# Patient Record
Sex: Male | Born: 1955 | Race: White | Hispanic: No | Marital: Married | Smoking: Former smoker
Health system: Southern US, Community
[De-identification: ages and names within clinical notes are randomized; demographics above are authoritative.]

## PROBLEM LIST (undated history)

## (undated) DIAGNOSIS — E7800 Pure hypercholesterolemia, unspecified: Secondary | ICD-10-CM

## (undated) HISTORY — PX: TONSILLECTOMY: SUR1361

---

## 1995-09-13 HISTORY — PX: VASECTOMY: SHX75

## 2008-03-14 DIAGNOSIS — E782 Mixed hyperlipidemia: Secondary | ICD-10-CM | POA: Insufficient documentation

## 2008-08-04 LAB — BASIC METABOLIC PANEL: Creatinine: 1 mg/dL (ref 0.6–1.3)

## 2012-07-13 ENCOUNTER — Ambulatory Visit: Payer: Self-pay | Admitting: Medical

## 2012-09-10 LAB — HM COLONOSCOPY

## 2013-10-25 LAB — BASIC METABOLIC PANEL
BUN: 19 mg/dL (ref 4–21)
GLUCOSE: 94 mg/dL
POTASSIUM: 4.5 mmol/L (ref 3.4–5.3)

## 2013-10-25 LAB — LIPID PANEL
Cholesterol: 178 mg/dL (ref 0–200)
HDL: 41 mg/dL (ref 35–70)
LDL CALC: 119 mg/dL
Triglycerides: 88 mg/dL (ref 40–160)

## 2015-07-29 ENCOUNTER — Other Ambulatory Visit: Payer: Self-pay | Admitting: Family Medicine

## 2015-10-25 ENCOUNTER — Other Ambulatory Visit: Payer: Self-pay | Admitting: Family Medicine

## 2016-02-09 ENCOUNTER — Other Ambulatory Visit: Payer: Self-pay | Admitting: Family Medicine

## 2016-02-09 ENCOUNTER — Telehealth: Payer: Self-pay | Admitting: Family Medicine

## 2016-02-09 DIAGNOSIS — E785 Hyperlipidemia, unspecified: Secondary | ICD-10-CM

## 2016-02-09 MED ORDER — PRAVASTATIN SODIUM 20 MG PO TABS: 20.0000 mg | ORAL_TABLET | Freq: Every day | ORAL | Status: DC

## 2016-02-09 NOTE — Telephone Encounter (Signed)
I have refilled but it is time for an office visit.

## 2016-02-09 NOTE — Telephone Encounter (Signed)
Please review. Thanks!  

## 2016-02-09 NOTE — Telephone Encounter (Signed)
Pt requesting a refill on his pravastatin (PRAVACHOL) 20 MG tablet.    Pt is out of this medication.  [facility]  Thanks CC

## 2016-02-10 NOTE — Telephone Encounter (Signed)
Patient has been advised

## 2016-05-17 ENCOUNTER — Ambulatory Visit (INDEPENDENT_AMBULATORY_CARE_PROVIDER_SITE_OTHER): Payer: Managed Care, Other (non HMO) | Admitting: Family Medicine

## 2016-05-17 ENCOUNTER — Encounter: Payer: Self-pay | Admitting: Family Medicine

## 2016-05-17 VITALS — BP 122/84 | HR 70 | Temp 97.6°F | Resp 16 | Ht 71.0 in | Wt 181.6 lb

## 2016-05-17 DIAGNOSIS — E782 Mixed hyperlipidemia: Secondary | ICD-10-CM | POA: Diagnosis not present

## 2016-05-17 DIAGNOSIS — Z0000 Encounter for general adult medical examination without abnormal findings: Secondary | ICD-10-CM | POA: Diagnosis not present

## 2016-05-17 NOTE — Patient Instructions (Signed)
We will call you with the lab results. 

## 2016-05-17 NOTE — Progress Notes (Signed)
Subjective:     Patient ID: Ronald Neal Fellman, male   DOB: December 16, 1955, 60 y.o.   MRN: [id number]  HPI  Chief Complaint  Patient presents with  . Annual Exam    Patient comes in office today for his anual physical, patient states that he would like to address symptoms of fatigue that has been occuring over the last 3 years and to address frequency of urination in PM. Patients last reported Tdap: 03/12/08, Colonoscopy: 09/10/12, patient is due today for Flu vaccine but he has declined stating that he will get it at work.   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   Review of Systems General: Feeling well, will check his insurance regarding Zostavax. States he does stretching in the AM but no other regular exercise. HEENT: regular dental visits and eye exams. Cardiovascular: no chest pain, shortness of breath, or palpitations GI: rare heartburn, no change in bowel habits or blood in the stool GU: nocturia x 1, no change in bladder habits  Psychiatric: not depressed; states his wife has chronic fatigue syndrome andit requires a lot of patience to live with her. Musculoskeletal: no joint pain    Objective:   Physical Exam  Constitutional: He appears well-developed and well-nourished. No distress.  Eyes: PERRLA, EOMI Neck: no thyromegaly, tenderness or nodules, no carotid bruits, no cervical adenopathy ENT: TM's intact without inflammation; No tonsillar enlargement or exudate, Lungs: Clear Heart : RRR without murmur or gallop Abd: bowel sounds present, soft, non-tender, no organomegaly Rectal: Prostate firm and non-tender Extremities: no edema Skin: no atypical lesions noted on his back     Assessment:    1. Annual physical exam - Comprehensive metabolic panel - PSA - CBC with Differential/Platelet  2. Combined fat and carbohydrate induced hyperlipemia - Lipid panel    Plan:    Further f/u pending lab work.

## 2016-05-18 ENCOUNTER — Other Ambulatory Visit: Payer: Self-pay | Admitting: Family Medicine

## 2016-05-18 ENCOUNTER — Telehealth: Payer: Self-pay

## 2016-05-18 DIAGNOSIS — E785 Hyperlipidemia, unspecified: Secondary | ICD-10-CM

## 2016-05-18 LAB — COMPREHENSIVE METABOLIC PANEL
A/G RATIO: 2.2 (ref 1.2–2.2)
ALBUMIN: 4.8 g/dL (ref 3.6–4.8)
ALT: 20 IU/L (ref 0–44)
AST: 19 IU/L (ref 0–40)
Alkaline Phosphatase: 57 IU/L (ref 39–117)
BUN/Creatinine Ratio: 15 (ref 10–24)
BUN: 17 mg/dL (ref 8–27)
Bilirubin Total: 0.6 mg/dL (ref 0.0–1.2)
CALCIUM: 9.4 mg/dL (ref 8.6–10.2)
CO2: 25 mmol/L (ref 18–29)
CREATININE: 1.15 mg/dL (ref 0.76–1.27)
Chloride: 103 mmol/L (ref 96–106)
GFR, EST AFRICAN AMERICAN: 78 mL/min/{1.73_m2} (ref >59)
GFR, EST NON AFRICAN AMERICAN: 68 mL/min/{1.73_m2} (ref >59)
GLOBULIN, TOTAL: 2.2 g/dL (ref 1.5–4.5)
Glucose: 92 mg/dL (ref 65–99)
POTASSIUM: 5.2 mmol/L (ref 3.5–5.2)
SODIUM: 143 mmol/L (ref 134–144)
TOTAL PROTEIN: 7 g/dL (ref 6.0–8.5)

## 2016-05-18 LAB — CBC WITH DIFFERENTIAL/PLATELET
BASOS ABS: 0.1 10*3/uL (ref 0.0–0.2)
BASOS: 1 %
EOS (ABSOLUTE): 0.6 10*3/uL — AB (ref 0.0–0.4)
Eos: 10 %
Hematocrit: 44.7 % (ref 37.5–51.0)
Hemoglobin: 15.5 g/dL (ref 12.6–17.7)
IMMATURE GRANULOCYTES: 1 %
Immature Grans (Abs): 0 10*3/uL (ref 0.0–0.1)
LYMPHS: 36 %
Lymphocytes Absolute: 2.3 10*3/uL (ref 0.7–3.1)
MCH: 29.9 pg (ref 26.6–33.0)
MCHC: 34.7 g/dL (ref 31.5–35.7)
MCV: 86 fL (ref 79–97)
MONOS ABS: 0.5 10*3/uL (ref 0.1–0.9)
Monocytes: 8 %
NEUTROS PCT: 44 %
Neutrophils Absolute: 2.9 10*3/uL (ref 1.4–7.0)
PLATELETS: 201 10*3/uL (ref 150–379)
RBC: 5.19 x10E6/uL (ref 4.14–5.80)
RDW: 13.5 % (ref 12.3–15.4)
WBC: 6.4 10*3/uL (ref 3.4–10.8)

## 2016-05-18 LAB — LIPID PANEL
CHOLESTEROL TOTAL: 188 mg/dL (ref 100–199)
Chol/HDL Ratio: 4.9 ratio units (ref 0.0–5.0)
HDL: 38 mg/dL — ABNORMAL LOW (ref >39)
LDL Calculated: 125 mg/dL — ABNORMAL HIGH (ref 0–99)
Triglycerides: 127 mg/dL (ref 0–149)
VLDL Cholesterol Cal: 25 mg/dL (ref 5–40)

## 2016-05-18 LAB — PSA: Prostate Specific Ag, Serum: 2.4 ng/mL (ref 0.0–4.0)

## 2016-05-18 MED ORDER — PRAVASTATIN SODIUM 40 MG PO TABS
40.0000 mg | ORAL_TABLET | Freq: Every day | ORAL | 3 refills | Status: DC
Start: 2016-05-18 — End: 2017-05-25

## 2016-05-18 NOTE — Telephone Encounter (Signed)
Advised pt of lab results. Pt verbally acknowledges understanding. Emily Drozdowski, CMA   

## 2016-05-18 NOTE — Telephone Encounter (Signed)
-----   Message from Anola Gurneyobert Chauvin, [place] sent at 05/18/2016  7:54 AM EDT ----- Lab ok except cholesterol is not at goal of LDL < 100. Will increase Pravastatin to 40 mg.. Call for lab slip in 6-8 weeks to recheck.

## 2016-05-18 NOTE — Telephone Encounter (Signed)
[id number] 

## 2016-06-03 ENCOUNTER — Ambulatory Visit: Payer: Managed Care, Other (non HMO) | Admitting: Family Medicine

## 2016-06-06 ENCOUNTER — Encounter: Payer: Self-pay | Admitting: Family Medicine

## 2016-06-06 ENCOUNTER — Ambulatory Visit (INDEPENDENT_AMBULATORY_CARE_PROVIDER_SITE_OTHER): Payer: Managed Care, Other (non HMO) | Admitting: Family Medicine

## 2016-06-06 VITALS — Temp 98.5°F

## 2016-06-06 DIAGNOSIS — Z23 Encounter for immunization: Secondary | ICD-10-CM

## 2016-06-06 NOTE — Progress Notes (Signed)
Patient comes into office today to receive shingles vaccine, patient states that he is feeling well today and has no other questions or concerns. Patient was offered flu vaccine today but has declined stating that he will receive it at work.

## 2017-04-17 ENCOUNTER — Encounter: Payer: Self-pay | Admitting: Family Medicine

## 2017-05-25 ENCOUNTER — Other Ambulatory Visit: Payer: Self-pay | Admitting: Family Medicine

## 2017-05-25 DIAGNOSIS — E785 Hyperlipidemia, unspecified: Secondary | ICD-10-CM

## 2017-10-11 ENCOUNTER — Other Ambulatory Visit: Payer: Self-pay | Admitting: Family Medicine

## 2017-10-11 DIAGNOSIS — E785 Hyperlipidemia, unspecified: Secondary | ICD-10-CM

## 2017-10-11 NOTE — Telephone Encounter (Signed)
Pharmacy requesting refills. Thanks!  

## 2017-10-13 ENCOUNTER — Encounter: Payer: Self-pay | Admitting: Family Medicine

## 2017-10-13 ENCOUNTER — Ambulatory Visit: Payer: Commercial Managed Care - PPO | Admitting: Family Medicine

## 2017-10-13 VITALS — BP 124/80 | HR 103 | Temp 100.1°F | Resp 16 | Wt 184.8 lb

## 2017-10-13 DIAGNOSIS — R509 Fever, unspecified: Secondary | ICD-10-CM

## 2017-10-13 DIAGNOSIS — R05 Cough: Secondary | ICD-10-CM

## 2017-10-13 DIAGNOSIS — R059 Cough, unspecified: Secondary | ICD-10-CM

## 2017-10-13 LAB — POCT INFLUENZA A/B
Influenza A, POC: NEGATIVE
Influenza B, POC: NEGATIVE

## 2017-10-13 MED ORDER — DOXYCYCLINE HYCLATE 100 MG PO TABS: 100.0000 mg | ORAL_TABLET | Freq: Two times a day (BID) | ORAL | 0 refills | Status: DC

## 2017-10-13 MED ORDER — HYDROCODONE-HOMATROPINE 5-1.5 MG/5ML PO SYRP: ORAL_SOLUTION | ORAL | 0 refills | Status: DC

## 2017-10-13 NOTE — Progress Notes (Signed)
Subjective:     Patient ID: Ronald Neal, male   DOB: 1956-01-19, 62 y.o.   MRN: [id number] Chief Complaint  Patient presents with  . Cough    Patient comes into office today with complaints of cough, fever and congestion for about 10 days. Patient reports the following; sore throat, fever high of 100.2, non productive cough, gas, and feeling of weakness. Patient has tried otc Nyquil and cough drops.    HPI States he developed cold sx 10 days ago along with family members. Over the last 3 days he has developed fevers, chills and increased fatigue. He has continued to "drag" himself into work.  Review of Systems     Objective:   Physical Exam  Constitutional: He appears well-developed and well-nourished. No distress.  Ears: T.M's intact without inflammation Sinuses: non-tender Throat: tonsils absent, no erythema Neck: no cervical adenopathy Lungs: clear     Assessment:    1. Fever, unspecified fever cause - POCT Influenza A/B - DG Chest 2 View; Future - doxycycline (VIBRA-TABS) 100 MG tablet; Take 1 tablet (100 mg total) by mouth 2 (two) times daily.  Dispense: 20 tablet; Refill: 0  2. Cough:  ? Pneumonia  - DG Chest 2 View; Future - HYDROcodone-homatropine (HYCODAN) 5-1.5 MG/5ML syrup; 5 ml 4-6 hours as needed for cough  Dispense: 120 mL; Refill: 0 - doxycycline (VIBRA-TABS) 100 MG tablet; Take 1 tablet (100 mg total) by mouth 2 (two) times daily.  Dispense: 20 tablet; Refill: 0    Plan:    Further f/u pending x-ray report.

## 2017-10-13 NOTE — Patient Instructions (Signed)
We will call you with the x-ray report 

## 2017-10-16 ENCOUNTER — Other Ambulatory Visit: Payer: Self-pay | Admitting: Family Medicine

## 2017-10-16 ENCOUNTER — Ambulatory Visit
Admission: RE | Admit: 2017-10-16 | Discharge: 2017-10-16 | Disposition: A | Discharge status: Home / Self Care | Payer: Commercial Managed Care - PPO | Source: Ambulatory Visit | Attending: Family Medicine | Admitting: Family Medicine

## 2017-10-16 ENCOUNTER — Telehealth: Payer: Self-pay

## 2017-10-16 DIAGNOSIS — R509 Fever, unspecified: Secondary | ICD-10-CM | POA: Diagnosis not present

## 2017-10-16 DIAGNOSIS — R059 Cough, unspecified: Secondary | ICD-10-CM

## 2017-10-16 DIAGNOSIS — Z131 Encounter for screening for diabetes mellitus: Secondary | ICD-10-CM

## 2017-10-16 DIAGNOSIS — R05 Cough: Secondary | ICD-10-CM | POA: Insufficient documentation

## 2017-10-16 DIAGNOSIS — Z125 Encounter for screening for malignant neoplasm of prostate: Secondary | ICD-10-CM

## 2017-10-16 DIAGNOSIS — E782 Mixed hyperlipidemia: Secondary | ICD-10-CM

## 2017-10-16 NOTE — Telephone Encounter (Signed)
-----   Message from Anola Gurneyobert Chauvin, [place] sent at 10/16/2017 11:42 AM EST ----- No pneumonia. Continue antibiotic if helping.

## 2017-10-16 NOTE — Telephone Encounter (Signed)
Patient was advised he states that he still has a cough but it is non productive, patient denies shortness of breath or wheezing. Patient stats that he has 4 days left of antibiotic, I instructed patient to continue medication for symptom relief and if symptoms change or worsen to call the office back. KW

## 2017-10-17 ENCOUNTER — Telehealth: Payer: Self-pay

## 2017-10-17 LAB — COMPREHENSIVE METABOLIC PANEL
A/G RATIO: 1.7 (ref 1.2–2.2)
ALK PHOS: 69 IU/L (ref 39–117)
ALT: 18 IU/L (ref 0–44)
AST: 18 IU/L (ref 0–40)
Albumin: 4.3 g/dL (ref 3.6–4.8)
BILIRUBIN TOTAL: 0.5 mg/dL (ref 0.0–1.2)
BUN/Creatinine Ratio: 14 (ref 10–24)
BUN: 13 mg/dL (ref 8–27)
CALCIUM: 9 mg/dL (ref 8.6–10.2)
CHLORIDE: 105 mmol/L (ref 96–106)
CO2: 23 mmol/L (ref 20–29)
Creatinine, Ser: 0.91 mg/dL (ref 0.76–1.27)
GFR calc Af Amer: 103 mL/min/{1.73_m2} (ref >59)
GFR calc non Af Amer: 89 mL/min/{1.73_m2} (ref >59)
GLOBULIN, TOTAL: 2.5 g/dL (ref 1.5–4.5)
Glucose: 101 mg/dL — ABNORMAL HIGH (ref 65–99)
POTASSIUM: 4.3 mmol/L (ref 3.5–5.2)
SODIUM: 145 mmol/L — AB (ref 134–144)
Total Protein: 6.8 g/dL (ref 6.0–8.5)

## 2017-10-17 LAB — LIPID PANEL
Chol/HDL Ratio: 4.2 ratio (ref 0.0–5.0)
Cholesterol, Total: 140 mg/dL (ref 100–199)
HDL: 33 mg/dL — AB (ref >39)
LDL Calculated: 90 mg/dL (ref 0–99)
Triglycerides: 84 mg/dL (ref 0–149)
VLDL Cholesterol Cal: 17 mg/dL (ref 5–40)

## 2017-10-17 LAB — PSA: Prostate Specific Ag, Serum: 3.7 ng/mL (ref 0.0–4.0)

## 2017-10-17 NOTE — Telephone Encounter (Signed)
See below. KW 

## 2017-10-17 NOTE — Telephone Encounter (Signed)
Patient advised he would like to know if his pravastatin can drop down to 20mg  since his labs have improved? KW

## 2017-10-17 NOTE — Telephone Encounter (Signed)
-----   Message from Anola Gurneyobert Chauvin, [place] sent at 10/17/2017  7:32 AM EST ----- Labs ok except for increase in your prostate blood test. Would repeat in 6 months (call for lab slip). If you are having changes in your urinary pattern (increased urination at night, pain, poor stream) may wish to see you in the office sooner.

## 2017-10-17 NOTE — Telephone Encounter (Signed)
[id number] 

## 2017-10-17 NOTE — Telephone Encounter (Signed)
I would not advise that as your cholesterol has improved because of the medication. It will keep your risk for heart attack and stroke low.

## 2017-10-18 NOTE — Telephone Encounter (Signed)
Wife has been advised. KW 

## 2018-01-08 ENCOUNTER — Other Ambulatory Visit: Payer: Self-pay | Admitting: Family Medicine

## 2018-01-08 DIAGNOSIS — E785 Hyperlipidemia, unspecified: Secondary | ICD-10-CM

## 2019-01-25 ENCOUNTER — Other Ambulatory Visit: Payer: Self-pay | Admitting: Family Medicine

## 2019-01-25 DIAGNOSIS — E785 Hyperlipidemia, unspecified: Secondary | ICD-10-CM

## 2019-01-25 MED ORDER — PRAVASTATIN SODIUM 40 MG PO TABS: 40.0000 mg | ORAL_TABLET | Freq: Every day | ORAL | 0 refills | Status: DC

## 2019-01-25 NOTE — Telephone Encounter (Signed)
Has not been seen since 10/2017.  Needs HLD f/u visit.  Can do as evisit or wait until June to be seen in office.  Ok to give 30 days worth to hold him over until appt if needed

## 2019-01-25 NOTE — Telephone Encounter (Signed)
[facility] following medications:  pravastatin (PRAVACHOL) 40 MG tablet   Please advise.  

## 2019-01-25 NOTE — Telephone Encounter (Signed)
Left patient a message to call back to schedule a e-visit or OV within the next 30 days. [facility]

## 2019-05-13 ENCOUNTER — Telehealth: Payer: Self-pay | Admitting: Family Medicine

## 2019-05-13 DIAGNOSIS — E785 Hyperlipidemia, unspecified: Secondary | ICD-10-CM

## 2019-05-13 NOTE — Telephone Encounter (Signed)
Pt needs a refill on   Pravastin 40 mg    [facility]  Pt had an appt for a PE with Simona Huh this week but has to resch because Simona Huh is out.Marland Kitchen He will call back and reschd.  Con Memos

## 2019-05-13 NOTE — Telephone Encounter (Signed)
RF for 1 month only.

## 2019-05-14 MED ORDER — PRAVASTATIN SODIUM 40 MG PO TABS: 40.0000 mg | ORAL_TABLET | Freq: Every day | ORAL | 0 refills | Status: DC

## 2019-05-16 ENCOUNTER — Encounter: Payer: Commercial Managed Care - PPO | Admitting: Family Medicine

## 2019-06-17 ENCOUNTER — Other Ambulatory Visit: Payer: Self-pay

## 2019-06-17 ENCOUNTER — Ambulatory Visit: Payer: Commercial Managed Care - PPO | Admitting: Family Medicine

## 2019-06-17 ENCOUNTER — Encounter: Payer: Self-pay | Admitting: Family Medicine

## 2019-06-17 VITALS — BP 160/92 | HR 76 | Temp 97.1°F | Wt 182.0 lb

## 2019-06-17 DIAGNOSIS — R030 Elevated blood-pressure reading, without diagnosis of hypertension: Secondary | ICD-10-CM

## 2019-06-17 DIAGNOSIS — E782 Mixed hyperlipidemia: Secondary | ICD-10-CM

## 2019-06-17 MED ORDER — PRAVASTATIN SODIUM 40 MG PO TABS: 40.0000 mg | ORAL_TABLET | Freq: Every day | ORAL | 3 refills | Status: DC

## 2019-06-17 NOTE — Progress Notes (Signed)
Ronald Neal  MRN: [id number] DOB: 1956-07-12  Subjective:  HPI   The patient is a 63 year old male who presents for follow up of his cholesterol.  He is a former patient of [facility].  He was last seen on 10/16/17. Lab Results  Component Value Date   CHOL 140 10/16/2017   HDL 33 (L) 10/16/2017   LDLCALC 90 10/16/2017   TRIG 84 10/16/2017   CHOLHDL 4.2 10/16/2017    Patient Active Problem List   Diagnosis Date Noted  . Combined fat and carbohydrate induced hyperlipemia 03/14/2008    No past medical history on file.  Social History   Socioeconomic History  . Marital status: Married    Spouse name: Not on file  . Number of children: Not on file  . Years of education: Not on file  . Highest education level: Not on file  Occupational History  . Not on file  Social Needs  . Financial resource strain: Not on file  . Food insecurity    Worry: Not on file    Inability: Not on file  . Transportation needs    Medical: Not on file    Non-medical: Not on file  Tobacco Use  . Smoking status: Former Smoker    Quit date: 09/12/1979    Years since quitting: 39.7  . Smokeless tobacco: Never Used  Substance and Sexual Activity  . Alcohol use: No  . Drug use: No  . Sexual activity: Not on file  Lifestyle  . Physical activity    Days per week: Not on file    Minutes per session: Not on file  . Stress: Not on file  Relationships  . Social Herbalist on phone: Not on file    Gets together: Not on file    Attends religious service: Not on file    Active member of club or organization: Not on file    Attends meetings of clubs or organizations: Not on file    Relationship status: Not on file  . Intimate partner violence    Fear of current or ex partner: Not on file    Emotionally abused: Not on file    Physically abused: Not on file    Forced sexual activity: Not on file  Other Topics Concern  . Not on file  Social History Narrative  . Not on file   Past  Surgical History:  Procedure Laterality Date  . TONSILLECTOMY    . VASECTOMY  1997   Family History  Problem Relation Age of Onset  . Heart disease Mother   . Colon polyps Mother   . Colon polyps Father   . Diabetes Son    Outpatient Encounter Medications as of 06/17/2019  Medication Sig  . Omega-3 Fatty Acids (FISH OIL) 1000 MG CAPS Take by mouth.  . pravastatin (PRAVACHOL) 40 MG tablet Take 1 tablet (40 mg total) by mouth daily.  . [DISCONTINUED] doxycycline (VIBRA-TABS) 100 MG tablet Take 1 tablet (100 mg total) by mouth 2 (two) times daily.  . [DISCONTINUED] HYDROcodone-homatropine (HYCODAN) 5-1.5 MG/5ML syrup 5 ml 4-6 hours as needed for cough   No facility-administered encounter medications on file as of 06/17/2019.    No Known Allergies  Review of Systems  Constitutional: Negative for chills, diaphoresis, fever and malaise/fatigue.  HENT: Negative for congestion, ear pain and sore throat.   Respiratory: Negative for cough, shortness of breath and wheezing.   Cardiovascular: Negative for chest pain, palpitations, claudication  and leg swelling.    Objective:  BP (!) 160/92 (BP Location: Right Arm, Patient Position: Sitting, Cuff Size: Normal)   Pulse 76   Temp (!) 97.1 F (36.2 C) (Skin)   Wt 182 lb (82.6 kg)   SpO2 99%   BMI 25.38 kg/m   Wt Readings from Last 3 Encounters:  06/17/19 182 lb (82.6 kg)  10/13/17 184 lb 12.8 oz (83.8 kg)  05/17/16 181 lb 9.6 oz (82.4 kg)   BP Readings from Last 3 Encounters:  06/17/19 (!) 160/92  10/13/17 124/80  05/17/16 122/84   Physical Exam  Constitutional: He is oriented to person, place, and time and well-developed, well-nourished, and in no distress.  HENT:  Head: Normocephalic.  Eyes: Conjunctivae are normal.  Neck: Neck supple.  Cardiovascular: Normal rate and regular rhythm.  Pulmonary/Chest: Effort normal and breath sounds normal.  Abdominal: Soft. Bowel sounds are normal.  Musculoskeletal: Normal range of motion.   Neurological: He is alert and oriented to person, place, and time.  Skin: No rash noted.  Psychiatric: Mood, affect and judgment normal.    Assessment and Plan :   1. Hyperlipidemia, mixed Tolerating the Pravastatin 40 mg qd without side effects. Family history positive with mother and brother having CABG surgery. Continue to maintain weight control, lower fats in diet and exercise regularly. Recheck CMP, TSH and Lipid Panel. Follow up pending reports. - Comprehensive metabolic panel - Lipid panel - TSH - pravastatin (PRAVACHOL) 40 MG tablet; Take 1 tablet (40 mg total) by mouth daily.  Dispense: 90 tablet; Refill: 3  2. Elevated BP without diagnosis of hypertension States he has been eating a lot of pizza recently. BP elevated to day without dyspnea, chest pains, palpitations or edema. Recommend lifestyle changes (no nicotine, limit ETOH, restrict caffeine and restrict salt intake). Will follow BP at home and get routine labs. Schedule CPE after the first of the year. - CBC with Differential/Platelet - Comprehensive metabolic panel - Lipid panel - TSH

## 2019-06-18 ENCOUNTER — Telehealth: Payer: Self-pay

## 2019-06-18 LAB — COMPREHENSIVE METABOLIC PANEL
ALT: 17 IU/L (ref 0–44)
AST: 21 IU/L (ref 0–40)
Albumin/Globulin Ratio: 2.1 (ref 1.2–2.2)
Albumin: 4.8 g/dL (ref 3.8–4.8)
Alkaline Phosphatase: 61 IU/L (ref 39–117)
BUN/Creatinine Ratio: 14 (ref 10–24)
BUN: 13 mg/dL (ref 8–27)
Bilirubin Total: 0.7 mg/dL (ref 0.0–1.2)
CO2: 22 mmol/L (ref 20–29)
Calcium: 9.5 mg/dL (ref 8.6–10.2)
Chloride: 105 mmol/L (ref 96–106)
Creatinine, Ser: 0.92 mg/dL (ref 0.76–1.27)
GFR calc Af Amer: 101 mL/min/{1.73_m2} (ref >59)
GFR calc non Af Amer: 87 mL/min/{1.73_m2} (ref >59)
Globulin, Total: 2.3 g/dL (ref 1.5–4.5)
Glucose: 91 mg/dL (ref 65–99)
Potassium: 4.4 mmol/L (ref 3.5–5.2)
Sodium: 145 mmol/L — ABNORMAL HIGH (ref 134–144)
Total Protein: 7.1 g/dL (ref 6.0–8.5)

## 2019-06-18 LAB — CBC WITH DIFFERENTIAL/PLATELET
Basophils Absolute: 0.1 10*3/uL (ref 0.0–0.2)
Basos: 1 %
EOS (ABSOLUTE): 0.6 10*3/uL — ABNORMAL HIGH (ref 0.0–0.4)
Eos: 9 %
Hematocrit: 46.4 % (ref 37.5–51.0)
Hemoglobin: 16.6 g/dL (ref 13.0–17.7)
Immature Grans (Abs): 0.1 10*3/uL (ref 0.0–0.1)
Immature Granulocytes: 1 %
Lymphocytes Absolute: 2.3 10*3/uL (ref 0.7–3.1)
Lymphs: 33 %
MCH: 30.2 pg (ref 26.6–33.0)
MCHC: 35.8 g/dL — ABNORMAL HIGH (ref 31.5–35.7)
MCV: 85 fL (ref 79–97)
Monocytes Absolute: 0.5 10*3/uL (ref 0.1–0.9)
Monocytes: 7 %
Neutrophils Absolute: 3.4 10*3/uL (ref 1.4–7.0)
Neutrophils: 49 %
Platelets: 224 10*3/uL (ref 150–450)
RBC: 5.49 x10E6/uL (ref 4.14–5.80)
RDW: 12.4 % (ref 11.6–15.4)
WBC: 6.9 10*3/uL (ref 3.4–10.8)

## 2019-06-18 LAB — TSH: TSH: 2.47 u[IU]/mL (ref 0.450–4.500)

## 2019-06-18 LAB — LIPID PANEL
Chol/HDL Ratio: 4.9 ratio (ref 0.0–5.0)
Cholesterol, Total: 199 mg/dL (ref 100–199)
HDL: 41 mg/dL (ref >39)
LDL Chol Calc (NIH): 138 mg/dL — ABNORMAL HIGH (ref 0–99)
Triglycerides: 108 mg/dL (ref 0–149)
VLDL Cholesterol Cal: 20 mg/dL (ref 5–40)

## 2019-06-18 NOTE — Telephone Encounter (Signed)
LMTCB

## 2019-06-18 NOTE — Telephone Encounter (Signed)
-----   Message from Margo Common, [place] sent at 06/18/2019  7:39 AM EDT ----- No sign of anemia or infection on blood cell counts. Normal liver and kidney function tests. Sodium level a little above normal - probably from increased salt intake that had BP elevated. LDL cholesterol high and need to get back on the Pravastatin 40 mg qd regularly. Also, lower fats in diet (especially the pizza). Recheck progress in 3 months.

## 2019-06-20 NOTE — Telephone Encounter (Signed)
Advised 

## 2020-05-09 ENCOUNTER — Ambulatory Visit
Admission: EM | Admit: 2020-05-09 | Discharge: 2020-05-09 | Disposition: A | Discharge status: Home / Self Care | Payer: Commercial Managed Care - PPO | Attending: Internal Medicine | Admitting: Internal Medicine

## 2020-05-09 ENCOUNTER — Other Ambulatory Visit: Payer: Self-pay

## 2020-05-09 ENCOUNTER — Ambulatory Visit (INDEPENDENT_AMBULATORY_CARE_PROVIDER_SITE_OTHER): Payer: Commercial Managed Care - PPO

## 2020-05-09 DIAGNOSIS — S63502A Unspecified sprain of left wrist, initial encounter: Secondary | ICD-10-CM | POA: Diagnosis not present

## 2020-05-09 DIAGNOSIS — M25532 Pain in left wrist: Secondary | ICD-10-CM

## 2020-05-09 HISTORY — DX: Pure hypercholesterolemia, unspecified: E78.00

## 2020-05-09 MED ORDER — IBUPROFEN 600 MG PO TABS: 600.0000 mg | ORAL_TABLET | Freq: Four times a day (QID) | ORAL | 0 refills | Status: DC | PRN

## 2020-05-09 MED ORDER — DICLOFENAC SODIUM 1 % EX GEL: 2.0000 g | Freq: Four times a day (QID) | CUTANEOUS | 0 refills | Status: DC

## 2020-05-09 NOTE — ED Triage Notes (Signed)
Patient in today w/ c/o Left wrist pain. Patient states the pain has been ongoing for several months but, has progressed since Monday. Patient denies injury to that hand.

## 2020-05-11 NOTE — ED Provider Notes (Signed)
[facility]    CSN: [id number] Arrival date & time: 05/09/20  1529      History   Chief Complaint Chief Complaint  Patient presents with  . Arm Pain    Left    HPI Ronald Neal is a 64 y.o. male comes to the urgent care with several months history of left wrist pain.  Patient denies any trauma to the left wrist.  Symptoms have recently worsened over the past week as a visit to the urgent care.  No fever or chills.  No numbness or tingling in the fingers.  He denies falling.Marland Kitchen   HPI  Past Medical History:  Diagnosis Date  . Hypercholesteremia     There are no problems to display for this patient.   Past Surgical History:  Procedure Laterality Date  . TONSILLECTOMY    . VASECTOMY  1997       Home Medications    Prior to Admission medications   Medication Sig Start Date End Date Taking? Authorizing Provider  Omega-3 Fatty Acids (FISH OIL) 1000 MG CAPS Take by mouth.   Yes [provider]  pravastatin (PRAVACHOL) 40 MG tablet Take 1 tablet (40 mg total) by mouth daily. 06/17/19  Yes Chrismon, Jodell Cipro, PA  diclofenac Sodium (VOLTAREN) 1 % GEL Apply 2 g topically 4 (four) times daily. 05/09/20   Merrilee Jansky, MD  ibuprofen (ADVIL) 600 MG tablet Take 1 tablet (600 mg total) by mouth every 6 (six) hours as needed. 05/09/20   LampteyBritta Mccreedy, MD    Family History Family History  Problem Relation Age of Onset  . Heart disease Mother   . Colon polyps Mother   . Colon polyps Father   . Diabetes Son     Social History Social History   Tobacco Use  . Smoking status: Former Smoker    Quit date: 09/12/1979    Years since quitting: 40.6  . Smokeless tobacco: Never Used  Substance Use Topics  . Alcohol use: No  . Drug use: No     Allergies   Patient has no known allergies.   Review of Systems Review of Systems  Gastrointestinal: Negative.   Genitourinary: Negative.   Musculoskeletal: Positive for arthralgias. Negative for joint  swelling and myalgias.  Skin: Negative.   Neurological: Negative.      Physical Exam Triage Vital Signs ED Triage Vitals [05/09/20 1605]  Enc Vitals Group     BP 126/75     Pulse Rate (!) 102     Resp 18     Temp 98.2 F (36.8 C)     Temp Source Oral     SpO2 99 %     Weight 180 lb (81.6 kg)     Height 6' (1.829 m)     Head Circumference      Peak Flow      Pain Score 7     Pain Loc      Pain Edu?      Excl. in GC?    No data found.  Updated Vital Signs BP 126/75 (BP Location: Right Arm)   Pulse (!) 102   Temp 98.2 F (36.8 C) (Oral)   Resp 18   Ht 6' (1.829 m)   Wt 81.6 kg   SpO2 99%   BMI 24.41 kg/m   Visual Acuity Right Eye Distance:   Left Eye Distance:   Bilateral Distance:    Right Eye Near:   Left Eye Near:  Bilateral Near:     Physical Exam Vitals and nursing note reviewed.  Constitutional:      General: He is not in acute distress.    Appearance: Normal appearance. He is not ill-appearing.  Cardiovascular:     Rate and Rhythm: Normal rate and regular rhythm.  Musculoskeletal:     Comments: Full range of motion around the wrist.  No swelling or deformity.  Some bruising lesions on the forearm.  Neurological:     Mental Status: He is alert.      UC Treatments / Results  Labs (all labs ordered are listed, but only abnormal results are displayed) Labs Reviewed - No data to display  EKG   Radiology DG Wrist Complete Left  Result Date: 05/09/2020 CLINICAL DATA:  Wrist pain for 5 days.  No known injury. EXAM: LEFT WRIST - COMPLETE 3+ VIEW COMPARISON:  None. FINDINGS: Lucencies in the distal scaphoid, likely bone cyst. No fractures identified. No other abnormalities. 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 No other abnormalities. Electronically Signed   By: Gerome Sam III M.D   On: 05/09/2020 16:44    Procedures Procedures (including critical care time)  Medications Ordered in  UC Medications - No data to display  Initial Impression / Assessment and Plan / UC Course  I have reviewed the triage vital signs and the nursing notes.  Pertinent labs & imaging results that were available during my care of the patient were reviewed by me and considered in my medical decision making (see chart for details).     1.  Left wrist sprain: X-ray of the left wrist is remarkable for lucencies in the scaphoid bone likely bone cysts. Ibuprofen 600 mg every 6 hours as needed for pain Voltaren gel as needed for pain Final Clinical Impressions(s) / UC Diagnoses   Final diagnoses:  Left wrist sprain, initial encounter   Discharge Instructions   None    ED Prescriptions    Medication Sig Dispense Auth. Provider   ibuprofen (ADVIL) 600 MG tablet Take 1 tablet (600 mg total) by mouth every 6 (six) hours as needed. 30 tablet Nazareth Kirk, Britta Mccreedy, MD   diclofenac Sodium (VOLTAREN) 1 % GEL Apply 2 g topically 4 (four) times daily. 150 g Bobby Ragan, Britta Mccreedy, MD     PDMP not reviewed this encounter.   Merrilee Jansky, MD 05/11/20 [phone]

## 2020-07-04 ENCOUNTER — Other Ambulatory Visit: Payer: Self-pay | Admitting: Family Medicine

## 2020-07-04 DIAGNOSIS — E782 Mixed hyperlipidemia: Secondary | ICD-10-CM

## 2020-07-04 NOTE — Telephone Encounter (Signed)
Requested medication (s) are due for refill today: yes  Requested medication (s) are on the active medication list: yes  Last refill:  06/17/19  Future visit scheduled: no  Notes to clinic:  called pt and LM on VM to call back to make appt.   Requested Prescriptions  Pending Prescriptions Disp Refills   pravastatin (PRAVACHOL) 40 MG tablet [Pharmacy Med Name: PRAVASTATIN SODIUM 40 MG TAB] 90 tablet 3    Sig: TAKE 1 TABLET BY MOUTH EVERY DAY      Cardiovascular:  Antilipid - Statins Failed - 07/04/2020  8:47 AM      Failed - Total Cholesterol in normal range and within 360 days    Cholesterol, Total  Date Value Ref Range Status  06/17/2019 199 100 - 199 mg/dL Final          Failed - LDL in normal range and within 360 days    LDL Chol Calc (NIH)  Date Value Ref Range Status  06/17/2019 138 (H) 0 - 99 mg/dL Final          Failed - HDL in normal range and within 360 days    HDL  Date Value Ref Range Status  06/17/2019 41 >39 mg/dL Final          Failed - Triglycerides in normal range and within 360 days    Triglycerides  Date Value Ref Range Status  06/17/2019 108 0 - 149 mg/dL Final          Failed - Valid encounter within last 12 months    Recent Outpatient Visits           1 year ago Hyperlipidemia, mixed   [facility], [place], [place]   2 years ago Fever, unspecified fever cause   [facility], [place], [place]   4 years ago Annual physical exam   [facility] [place], [place]              UUVOZD - Patient is not pregnant

## 2020-07-13 ENCOUNTER — Ambulatory Visit: Payer: Commercial Managed Care - PPO | Admitting: Podiatry

## 2020-07-13 ENCOUNTER — Other Ambulatory Visit: Payer: Self-pay

## 2020-07-13 ENCOUNTER — Encounter: Payer: Self-pay | Admitting: Podiatry

## 2020-07-13 DIAGNOSIS — L603 Nail dystrophy: Secondary | ICD-10-CM

## 2020-07-13 DIAGNOSIS — L602 Onychogryphosis: Secondary | ICD-10-CM

## 2020-07-13 MED ORDER — NEOMYCIN-POLYMYXIN-HC 3.5-10000-1 OT SUSP: OTIC | 0 refills | Status: DC

## 2020-07-13 NOTE — Patient Instructions (Signed)

## 2020-07-13 NOTE — Progress Notes (Signed)
  Subjective:  Patient ID: Ronald Neal, male    DOB: 1956-08-04,  MRN: [id number]  Chief Complaint  Patient presents with  . Nail Problem    Patient presents today for nail dystrophy right great toenail x years    64 y.o. male presents with the above complaint. History confirmed with patient. Had prior ingrown toenail surgery and has not looked normal since  Objective:  Physical Exam: warm, good capillary refill, no trophic changes or ulcerative lesions, normal DP and PT pulses and normal sensory exam.   Right Foot: dystrophic and gryphotic toenail hallux    Assessment:   1. Dystrophic nail      Plan:  Patient was evaluated and treated and all questions answered.     Dystrophic Nail, right -Patient elects to proceed with minor surgery to remove dystrophic toenail today. Consent reviewed and signed by patient. -dystrophic nail excised. See procedure note. -Educated on post-procedure care including soaking. Written instructions provided and reviewed. -Patient to follow up in 2 weeks for nail check.  Procedure: Excision of dystrophic Toenail Location: Right 1st toe nail. Anesthesia: Lidocaine 1% plain; 1.5 mL and Marcaine 0.5% plain; 1.5 mL, digital block. Skin Prep: Betadine. Dressing: Silvadene; telfa; dry, sterile, compression dressing. Technique: Following skin prep, the toe was exsanguinated and a tourniquet was secured at the base of the toe. The affected nail was freed and excised. Chemical matrixectomy was then performed with phenol and irrigated out with alcohol. The tourniquet was then removed and sterile dressing applied. Disposition: Patient tolerated procedure well. Patient to return in 2 weeks for follow-up.     Return in about 2 weeks (around 07/27/2020).

## 2020-07-27 ENCOUNTER — Ambulatory Visit: Payer: Commercial Managed Care - PPO | Admitting: Podiatry

## 2020-07-28 ENCOUNTER — Other Ambulatory Visit: Payer: Self-pay | Admitting: Family Medicine

## 2020-07-28 ENCOUNTER — Telehealth: Payer: Self-pay

## 2020-07-28 DIAGNOSIS — R030 Elevated blood-pressure reading, without diagnosis of hypertension: Secondary | ICD-10-CM

## 2020-07-28 DIAGNOSIS — E782 Mixed hyperlipidemia: Secondary | ICD-10-CM

## 2020-07-28 DIAGNOSIS — Z125 Encounter for screening for malignant neoplasm of prostate: Secondary | ICD-10-CM

## 2020-07-28 NOTE — Telephone Encounter (Signed)
Placed order for CMP, Lipid Panel, CBC, TSH and PSA in chart to be released when he comes in for fasting labs.

## 2020-07-28 NOTE — Telephone Encounter (Signed)
Copied from CRM [id number]. Topic: General - Inquiry >> Jul 27, 2020  4:40 PM Adrian Prince D wrote: Reason for CRM: Patient wants to come in the Monday morning before his appointment to get his lab work done. Please let the patient know if its okay. Please advise

## 2020-08-03 ENCOUNTER — Other Ambulatory Visit: Payer: Self-pay | Admitting: *Deleted

## 2020-08-03 DIAGNOSIS — Z125 Encounter for screening for malignant neoplasm of prostate: Secondary | ICD-10-CM

## 2020-08-03 DIAGNOSIS — E782 Mixed hyperlipidemia: Secondary | ICD-10-CM

## 2020-08-03 DIAGNOSIS — R030 Elevated blood-pressure reading, without diagnosis of hypertension: Secondary | ICD-10-CM

## 2020-08-03 NOTE — Progress Notes (Signed)
Complete physical exam   Patient: Ronald Neal   DOB: 12-Jul-1956   64 y.o. Male  MRN: [id number] Visit Date: 08/04/2020  Today's healthcare provider: Dortha Kern, PA   No chief complaint on file.  Subjective    Ronald Neal is a 64 y.o. male who presents today for a complete physical exam.  He reports consuming a general diet. The patient does not participate in regular exercise at present. He generally feels well. He reports sleeping well. He does not have additional problems to discuss today.    Past Medical History:  Diagnosis Date   Hypercholesteremia    Past Surgical History:  Procedure Laterality Date   TONSILLECTOMY     VASECTOMY  1997   Social History   Socioeconomic History   Marital status: Married    Spouse name: Not on file   Number of children: Not on file   Years of education: Not on file   Highest education level: Not on file  Occupational History   Not on file  Tobacco Use   Smoking status: Former Smoker    Quit date: 09/12/1979    Years since quitting: 40.9   Smokeless tobacco: Never Used  Substance and Sexual Activity   Alcohol use: No   Drug use: No   Sexual activity: Not on file  Other Topics Concern   Not on file  Social History Narrative   Not on file   Social Determinants of Health   Financial Resource Strain:    Difficulty of Paying Living Expenses: Not on file  Food Insecurity:    Worried About Programme researcher, broadcasting/film/video in the Last Year: Not on file   [employer] of Food in the Last Year: Not on file  Transportation Needs:    Lack of Transportation (Medical): Not on file   Lack of Transportation (Non-Medical): Not on file  Physical Activity:    Days of Exercise per Week: Not on file   Minutes of Exercise per Session: Not on file  Stress:    Feeling of Stress : Not on file  Social Connections:    Frequency of Communication with Friends and Family: Not on file   Frequency of Social Gatherings with Friends and Family: Not on file    Attends Religious Services: Not on file   Active Member of Clubs or Organizations: Not on file   Attends Banker Meetings: Not on file   Marital Status: Not on file  Intimate Partner Violence:    Fear of Current or Ex-Partner: Not on file   Emotionally Abused: Not on file   Physically Abused: Not on file   Sexually Abused: Not on file   Family Status  Relation Name Status   Mother  Alive   Father  Deceased at age 78       blood disorder   Sister  Nature conservation officer   Daughter  Alive   Son  Alive   Sister  Alive   Family History  Problem Relation Age of Onset   Heart disease Mother    Colon polyps Mother    Colon polyps Father    Diabetes Son    No Known Allergies  Patient Care Team: [place], Molly Maduro, [place] as PCP - General (Family Medicine)   Medications: Outpatient Medications Prior to Visit  Medication Sig   Coenzyme Q10 (CO Q-10) 100 MG CAPS Take by mouth.   Ginkgo Biloba 40 MG TABS Take by  mouth.   ibuprofen (ADVIL) 600 MG tablet Take 1 tablet (600 mg total) by mouth every 6 (six) hours as needed.   Multiple Vitamin (MULTIVITAMIN) capsule Take 1 capsule by mouth daily.   neomycin-polymyxin-hydrocortisone (CORTISPORIN) 3.5-10000-1 OTIC suspension Apply 1-2 drops daily after soaking and cover with bandaid   Omega-3 Fatty Acids (FISH OIL) 1000 MG CAPS Take by mouth.   pravastatin (PRAVACHOL) 40 MG tablet Take 1 tablet (40 mg total) by mouth daily.   No facility-administered medications prior to visit.    Review of Systems  Constitutional: Negative.   HENT: Negative.   Eyes: Negative.   Respiratory: Negative.   Cardiovascular: Negative.   Gastrointestinal: Negative.   Endocrine: Negative.   Genitourinary: Negative.   Musculoskeletal: Positive for arthralgias.  Skin: Negative.   Allergic/Immunologic: Negative.   Neurological: Negative.   Hematological: Negative.   Psychiatric/Behavioral: Negative.      Objective    BP (!) 159/90 (BP  Location: Right Arm, Patient Position: Sitting, Cuff Size: Normal)    Pulse 87    Temp 97.6 F (36.4 C) (Oral)    Wt 179 lb (81.2 kg)    SpO2 100%    BMI 24.28 kg/m     Physical Exam Constitutional:      Appearance: Normal appearance. He is normal weight.  HENT:     Head: Normocephalic and atraumatic.     Right Ear: Tympanic membrane, ear canal and external ear normal.     Left Ear: Tympanic membrane, ear canal and external ear normal.     Nose: Nose normal.     Mouth/Throat:     Mouth: Mucous membranes are moist.     Pharynx: Oropharynx is clear.  Eyes:     Extraocular Movements: Extraocular movements intact.     Conjunctiva/sclera: Conjunctivae normal.     Pupils: Pupils are equal, round, and reactive to light.  Cardiovascular:     Rate and Rhythm: Normal rate and regular rhythm.     Pulses: Normal pulses.     Heart sounds: Normal heart sounds.  Pulmonary:     Effort: Pulmonary effort is normal.     Breath sounds: Normal breath sounds.  Abdominal:     General: Abdomen is flat. Bowel sounds are normal.     Palpations: Abdomen is soft.  Musculoskeletal:        General: Normal range of motion.     Cervical back: Normal range of motion and neck supple.  Skin:    General: Skin is warm and dry.  Neurological:     General: No focal deficit present.     Mental Status: He is alert and oriented to person, place, and time. Mental status is at baseline.  Psychiatric:        Mood and Affect: Mood normal.        Behavior: Behavior normal.        Thought Content: Thought content normal.        Judgment: Judgment normal.      Last depression screening scores PHQ 2/9 Scores 08/04/2020 05/17/2016  PHQ - 2 Score 0 0  PHQ- 9 Score 3 -   Last fall risk screening Fall Risk  08/04/2020  Falls in the past year? 0  Number falls in past yr: 0  Injury with Fall? 0   Last Audit-C alcohol use screening Alcohol Use Disorder Test (AUDIT) 08/04/2020  1. How often do you have a drink  containing alcohol? 0  2. How many drinks containing alcohol  do you have on a typical day when you are drinking? 0  3. How often do you have six or more drinks on one occasion? 0  AUDIT-C Score 0  Alcohol Brief Interventions/Follow-up AUDIT Score <7 follow-up not indicated   A score of 3 or more in women, and 4 or more in men indicates increased risk for alcohol abuse, EXCEPT if all of the points are from question 1   No results found for any visits on 08/04/20.  Assessment & Plan    Routine Health Maintenance and Physical Exam  Exercise Activities and Dietary recommendations Goals   Recommend low fat diet with regular exercise for 30-40 minutes 3-4 days a week.     Immunization History  Administered Date(s) Administered   Influenza-Unspecified 06/17/2019   Td 03/12/2008, 07/14/2012   Tdap 03/12/2008   Zoster 06/06/2016    Health Maintenance  Topic Date Due   Hepatitis C Screening  Never done   COVID-19 Vaccine (1) Never done   PNA vac Low Risk Adult (1 of 2 - PCV13) Never done   INFLUENZA VACCINE  04/12/2020   TETANUS/TDAP  07/14/2022   COLONOSCOPY  09/10/2022    Discussed health benefits of physical activity, and encouraged him to engage in regular exercise appropriate for his age and condition.  1. Annual physical exam General health good. Had labs done yesterday. Counseled regarding health maintenance.  2. Elevated BP without diagnosis of hypertension BP elevated today. Normal renal function and blood counts on test results from yesterday. Wants to work on sodium restriction and life style changes to get BP down. Monitor BP at home and recheck in 3 months.  3. Mixed hyperlipidemia Lipid Panel     Component Value Date/Time   CHOL 173 08/03/2020 1022   TRIG 89 08/03/2020 1022   HDL 38 (L) 08/03/2020 1022   CHOLHDL 4.6 08/03/2020 1022   LDLCALC 118 (H) 08/03/2020 1022   LABVLDL 17 08/03/2020 1022  Work on low fat diet and start exercise program with increase in  water intake. These levels are better than the last check a year ago. Continue Pravastatin 40 mg qd.   4. Screening for prostate cancer Asymptomatic. PSA 4.8 yesterday. DRE without nodules. Recheck annually.   No follow-ups on file.     Haywood Pao, PA, have reviewed all documentation for this visit. The documentation on 08/04/20 for the exam, diagnosis, procedures, and orders are all accurate and complete.    Dortha Kern, PA  [facility] [phone] (phone) [phone] (fax)  [facility]

## 2020-08-04 ENCOUNTER — Ambulatory Visit (INDEPENDENT_AMBULATORY_CARE_PROVIDER_SITE_OTHER): Payer: Commercial Managed Care - PPO | Admitting: Family Medicine

## 2020-08-04 ENCOUNTER — Encounter: Payer: Self-pay | Admitting: Family Medicine

## 2020-08-04 ENCOUNTER — Other Ambulatory Visit: Payer: Self-pay

## 2020-08-04 VITALS — BP 159/90 | HR 87 | Temp 97.6°F | Wt 179.0 lb

## 2020-08-04 DIAGNOSIS — Z0000 Encounter for general adult medical examination without abnormal findings: Secondary | ICD-10-CM

## 2020-08-04 DIAGNOSIS — Z125 Encounter for screening for malignant neoplasm of prostate: Secondary | ICD-10-CM | POA: Diagnosis not present

## 2020-08-04 DIAGNOSIS — E782 Mixed hyperlipidemia: Secondary | ICD-10-CM

## 2020-08-04 DIAGNOSIS — R030 Elevated blood-pressure reading, without diagnosis of hypertension: Secondary | ICD-10-CM

## 2020-08-04 LAB — COMPREHENSIVE METABOLIC PANEL
ALT: 16 IU/L (ref 0–44)
AST: 18 IU/L (ref 0–40)
Albumin/Globulin Ratio: 2.3 — ABNORMAL HIGH (ref 1.2–2.2)
Albumin: 4.4 g/dL (ref 3.8–4.8)
Alkaline Phosphatase: 60 IU/L (ref 44–121)
BUN/Creatinine Ratio: 13 (ref 10–24)
BUN: 14 mg/dL (ref 8–27)
Bilirubin Total: 0.6 mg/dL (ref 0.0–1.2)
CO2: 23 mmol/L (ref 20–29)
Calcium: 9 mg/dL (ref 8.6–10.2)
Chloride: 105 mmol/L (ref 96–106)
Creatinine, Ser: 1.05 mg/dL (ref 0.76–1.27)
GFR calc Af Amer: 85 mL/min/{1.73_m2} (ref >59)
GFR calc non Af Amer: 74 mL/min/{1.73_m2} (ref >59)
Globulin, Total: 1.9 g/dL (ref 1.5–4.5)
Glucose: 92 mg/dL (ref 65–99)
Potassium: 4.3 mmol/L (ref 3.5–5.2)
Sodium: 141 mmol/L (ref 134–144)
Total Protein: 6.3 g/dL (ref 6.0–8.5)

## 2020-08-04 LAB — CBC WITH DIFFERENTIAL/PLATELET
Basophils Absolute: 0.1 10*3/uL (ref 0.0–0.2)
Basos: 1 %
EOS (ABSOLUTE): 0.7 10*3/uL — ABNORMAL HIGH (ref 0.0–0.4)
Eos: 10 %
Hematocrit: 45.5 % (ref 37.5–51.0)
Hemoglobin: 16.2 g/dL (ref 13.0–17.7)
Immature Grans (Abs): 0.1 10*3/uL (ref 0.0–0.1)
Immature Granulocytes: 1 %
Lymphocytes Absolute: 2 10*3/uL (ref 0.7–3.1)
Lymphs: 30 %
MCH: 30.2 pg (ref 26.6–33.0)
MCHC: 35.6 g/dL (ref 31.5–35.7)
MCV: 85 fL (ref 79–97)
Monocytes Absolute: 0.5 10*3/uL (ref 0.1–0.9)
Monocytes: 8 %
Neutrophils Absolute: 3.2 10*3/uL (ref 1.4–7.0)
Neutrophils: 50 %
Platelets: 222 10*3/uL (ref 150–450)
RBC: 5.37 x10E6/uL (ref 4.14–5.80)
RDW: 13.2 % (ref 11.6–15.4)
WBC: 6.5 10*3/uL (ref 3.4–10.8)

## 2020-08-04 LAB — LIPID PANEL
Chol/HDL Ratio: 4.6 ratio (ref 0.0–5.0)
Cholesterol, Total: 173 mg/dL (ref 100–199)
HDL: 38 mg/dL — ABNORMAL LOW (ref >39)
LDL Chol Calc (NIH): 118 mg/dL — ABNORMAL HIGH (ref 0–99)
Triglycerides: 89 mg/dL (ref 0–149)
VLDL Cholesterol Cal: 17 mg/dL (ref 5–40)

## 2020-08-04 LAB — TSH: TSH: 3.11 u[IU]/mL (ref 0.450–4.500)

## 2020-08-04 LAB — PSA: Prostate Specific Ag, Serum: 4.8 ng/mL — ABNORMAL HIGH (ref 0.0–4.0)

## 2020-08-10 ENCOUNTER — Other Ambulatory Visit: Payer: Self-pay | Admitting: Family Medicine

## 2020-08-10 DIAGNOSIS — E782 Mixed hyperlipidemia: Secondary | ICD-10-CM

## 2020-08-10 NOTE — Telephone Encounter (Signed)
Requested medication (s) are due for refill today: Yes  Requested medication (s) are on the active medication list: Yes  Last refill:  06/17/19  Future visit scheduled: No  Notes to clinic:  Prescription has expired.    Requested Prescriptions  Pending Prescriptions Disp Refills   pravastatin (PRAVACHOL) 40 MG tablet [Pharmacy Med Name: PRAVASTATIN SODIUM 40 MG TAB] 90 tablet 3    Sig: TAKE 1 TABLET BY MOUTH EVERY DAY      Cardiovascular:  Antilipid - Statins Failed - 08/10/2020  2:07 PM      Failed - LDL in normal range and within 360 days    LDL Chol Calc (NIH)  Date Value Ref Range Status  08/03/2020 118 (H) 0 - 99 mg/dL Final          Failed - HDL in normal range and within 360 days    HDL  Date Value Ref Range Status  08/03/2020 38 (L) >39 mg/dL Final          Passed - Total Cholesterol in normal range and within 360 days    Cholesterol, Total  Date Value Ref Range Status  08/03/2020 173 100 - 199 mg/dL Final          Passed - Triglycerides in normal range and within 360 days    Triglycerides  Date Value Ref Range Status  08/03/2020 89 0 - 149 mg/dL Final          Passed - Patient is not pregnant      Passed - Valid encounter within last 12 months    Recent Outpatient Visits           6 days ago    [facility], Jodell Cipro, [place]   1 year ago Hyperlipidemia, mixed   [facility], [place], [place]   2 years ago Fever, unspecified fever cause   [facility] [place], [place], [place]   4 years ago Annual physical exam   [facility] [place], [place], [place]

## 2020-08-12 ENCOUNTER — Other Ambulatory Visit: Payer: Self-pay | Admitting: Podiatry

## 2020-08-12 ENCOUNTER — Ambulatory Visit: Payer: Commercial Managed Care - PPO | Admitting: Podiatry

## 2020-08-12 ENCOUNTER — Other Ambulatory Visit: Payer: Self-pay

## 2020-08-12 ENCOUNTER — Encounter: Payer: Self-pay | Admitting: Podiatry

## 2020-08-12 ENCOUNTER — Ambulatory Visit (INDEPENDENT_AMBULATORY_CARE_PROVIDER_SITE_OTHER): Payer: Commercial Managed Care - PPO

## 2020-08-12 DIAGNOSIS — S92501A Displaced unspecified fracture of right lesser toe(s), initial encounter for closed fracture: Secondary | ICD-10-CM | POA: Diagnosis not present

## 2020-08-12 DIAGNOSIS — S99921A Unspecified injury of right foot, initial encounter: Secondary | ICD-10-CM

## 2020-08-15 NOTE — Progress Notes (Signed)
  Subjective:  Patient ID: Ronald Neal, male    DOB: [birth year],  MRN: [id number]  Chief Complaint  Patient presents with  . Toe Pain    Patient states "i accidentily kicked a toolbox about 2 weeks ago with my right foot.  My toes and foot swelled up and bruised.  Now my middle toe is still swollen and tender and it hurts to walk."    64 y.o. male returns with the above complaint. History confirmed with patient.   Objective:  Physical Exam: warm, good capillary refill, no trophic changes or ulcerative lesions, normal DP and PT pulses and normal sensory exam.   Right Foot: Pain elevation and edema to the third toe at the MTPJ  No images are attached to the encounter.  Radiographs: X-ray of the right foot: Fracture intra-articular of the base of the proximal phalanx of the third digit medial flare Assessment:   1. Right foot injury, initial encounter   2. Closed fracture of phalanx of right third toe, initial encounter      Plan:  Patient was evaluated and treated and all questions answered.  Discussed with him the nature and treatment options for toe fractures.  I recommended buddy splinting and should not use with Coban.  I recommended a surgical shoe but he preferred to stick with his current shoe gear and I think this is fine as his symptoms have been improving.  Return if symptoms worsen or fail to improve.

## 2021-02-05 ENCOUNTER — Other Ambulatory Visit: Payer: Self-pay | Admitting: Family Medicine

## 2021-02-05 DIAGNOSIS — E782 Mixed hyperlipidemia: Secondary | ICD-10-CM

## 2021-06-04 ENCOUNTER — Ambulatory Visit: Payer: Self-pay

## 2021-06-04 NOTE — Telephone Encounter (Signed)
Patient advised.

## 2021-06-04 NOTE — Telephone Encounter (Signed)
Pt called and stated that he has a bad cough and is requesting a z-pack. Let patient know I would have a nurse call back to get a clinical message to provider.

## 2021-06-04 NOTE — Telephone Encounter (Signed)
Pt. Started coughing Wednesday. Non-productive. Rapid test for COVID 19 at [facility] negative per pt. Asking for Z-Pak "to be called in." Offered to look for virtual visit for pt. Declines. "I'm not interested in anything like that." Also offered information on [email or web address] virtual visits. Verbalizes understanding. States he will continue OTC treatment.    Answer Assessment - Initial Assessment Questions 1. ONSET: "When did the cough begin?"      Wednesday 2. SEVERITY: "How bad is the cough today?"      Moderate 3. SPUTUM: "Describe the color of your sputum" (none, dry cough; clear, white, yellow, green)     N/a 4. HEMOPTYSIS: "Are you coughing up any blood?" If so ask: "How much?" (flecks, streaks, tablespoons, etc.)     No 5. DIFFICULTY BREATHING: "Are you having difficulty breathing?" If Yes, ask: "How bad is it?" (e.g., mild, moderate, severe)    - MILD: No SOB at rest, mild SOB with walking, speaks normally in sentences, can lie down, no retractions, pulse < 100.    - MODERATE: SOB at rest, SOB with minimal exertion and prefers to sit, cannot lie down flat, speaks in phrases, mild retractions, audible wheezing, pulse 100-120.    - SEVERE: Very SOB at rest, speaks in single words, struggling to breathe, sitting hunched forward, retractions, pulse > 120      None 6. FEVER: "Do you have a fever?" If Yes, ask: "What is your temperature, how was it measured, and when did it start?"     Not today 7. CARDIAC HISTORY: "Do you have any history of heart disease?" (e.g., heart attack, congestive heart failure)      No 8. LUNG HISTORY: "Do you have any history of lung disease?"  (e.g., pulmonary embolus, asthma, emphysema)     No 9. PE RISK FACTORS: "Do you have a history of blood clots?" (or: recent major surgery, recent prolonged travel, bedridden)     No 10. OTHER SYMPTOMS: "Do you have any other symptoms?" (e.g., runny nose, wheezing, chest pain)       No 11. PREGNANCY: "Is there any  chance you are pregnant?" "When was your last menstrual period?"       N/a 12. TRAVEL: "Have you traveled out of the country in the last month?" (e.g., travel history, exposures)       No  Protocols used: Cough - Acute Non-Productive-A-AH

## 2021-07-19 ENCOUNTER — Ambulatory Visit: Payer: Commercial Managed Care - PPO | Admitting: Dermatology

## 2021-07-19 ENCOUNTER — Other Ambulatory Visit: Payer: Self-pay

## 2021-07-19 DIAGNOSIS — L409 Psoriasis, unspecified: Secondary | ICD-10-CM

## 2021-07-19 DIAGNOSIS — Z840 Family history of diseases of the skin and subcutaneous tissue: Secondary | ICD-10-CM

## 2021-07-19 MED ORDER — ZORYVE 0.3 % EX CREA: 1.0000 "application " | TOPICAL_CREAM | Freq: Every day | CUTANEOUS | 2 refills | Status: DC

## 2021-07-19 NOTE — Patient Instructions (Signed)

## 2021-07-19 NOTE — Progress Notes (Signed)
   New Patient Visit  Subjective  Ronald Neal is a 65 y.o. male who presents for the following: Other (Spots of right lower leg and behind each ear - treating with [place] Psoriasis cream). Positive family history of psoriasis in his brother. Accompanied by wife who contributes to history.  The following portions of the chart were reviewed this encounter and updated as appropriate:   Tobacco  Allergies  Meds  Problems  Med Hx  Surg Hx  Fam Hx     Review of Systems:  No other skin or systemic complaints except as noted in HPI or Assessment and Plan.  Objective  Well appearing patient in no apparent distress; mood and affect are within normal limits.  A focused examination was performed including right leg, bilateral postauricular areas. Relevant physical exam findings are noted in the Assessment and Plan.  Right pretibial, right mastoid, left mastoid 6.0 x 4.0 cm pink plaque of right pretibial. Pink plaques of right mastoid and left mastoid.        Assessment & Plan  Psoriasis Right pretibial, right mastoid, left mastoid  Psoriasis is a chronic non-curable, but treatable genetic/hereditary disease that may have other systemic features affecting other organ systems such as joints (Psoriatic Arthritis). It is associated with an increased risk of inflammatory bowel disease, heart disease, non-alcoholic fatty liver disease, and depression.    Start Zoryve cream qd Discussed multiple other treatment options include oral injectable and other topical medications.  Roflumilast (ZORYVE) 0.3 % CREA - Right pretibial, right mastoid, left mastoid Apply 1 application topically daily.  Return in about 3 months (around 10/19/2021) for Psoriasis.  I, [name], CMA, am acting as scribe for [name], MD . Documentation: I have reviewed the above documentation for accuracy and completeness, and I agree with the above.  [name], MD

## 2021-07-20 ENCOUNTER — Encounter: Payer: Self-pay | Admitting: Dermatology

## 2021-08-02 ENCOUNTER — Other Ambulatory Visit: Payer: Self-pay

## 2021-08-02 ENCOUNTER — Telehealth: Payer: Self-pay | Admitting: Family Medicine

## 2021-08-02 DIAGNOSIS — E782 Mixed hyperlipidemia: Secondary | ICD-10-CM

## 2021-08-02 MED ORDER — PRAVASTATIN SODIUM 40 MG PO TABS: 40.0000 mg | ORAL_TABLET | Freq: Every day | ORAL | 0 refills | Status: DC

## 2021-08-02 NOTE — Telephone Encounter (Signed)
[facility] faxed refill request for the following medications:  pravastatin (PRAVACHOL) 40 MG tablet   Please advise.

## 2021-08-12 DIAGNOSIS — G1119 Other early-onset cerebellar ataxia: Secondary | ICD-10-CM

## 2021-08-12 HISTORY — DX: Other early-onset cerebellar ataxia: G11.19

## 2021-08-16 ENCOUNTER — Telehealth: Payer: Self-pay | Admitting: Family Medicine

## 2021-09-10 ENCOUNTER — Other Ambulatory Visit: Payer: Self-pay | Admitting: Family Medicine

## 2021-09-10 DIAGNOSIS — E782 Mixed hyperlipidemia: Secondary | ICD-10-CM

## 2021-09-21 ENCOUNTER — Other Ambulatory Visit: Payer: Self-pay | Admitting: Otolaryngology

## 2021-09-21 DIAGNOSIS — R42 Dizziness and giddiness: Secondary | ICD-10-CM

## 2021-09-30 ENCOUNTER — Ambulatory Visit
Admission: RE | Admit: 2021-09-30 | Discharge: 2021-09-30 | Disposition: A | Discharge status: Home / Self Care | Payer: Commercial Managed Care - PPO | Source: Ambulatory Visit | Attending: Otolaryngology | Admitting: Otolaryngology

## 2021-09-30 ENCOUNTER — Other Ambulatory Visit: Payer: Self-pay

## 2021-09-30 DIAGNOSIS — R42 Dizziness and giddiness: Secondary | ICD-10-CM | POA: Diagnosis present

## 2021-09-30 MED ORDER — GADOBUTROL 1 MMOL/ML IV SOLN
7.5000 mL | Freq: Once | INTRAVENOUS | Status: AC | PRN
  Administered 2021-09-30: 7.5 mL via INTRAVENOUS

## 2021-10-19 ENCOUNTER — Encounter: Payer: Commercial Managed Care - PPO | Admitting: Family Medicine

## 2021-10-28 ENCOUNTER — Ambulatory Visit: Payer: Commercial Managed Care - PPO | Admitting: Dermatology

## 2021-11-23 ENCOUNTER — Other Ambulatory Visit: Payer: Self-pay | Admitting: Family Medicine

## 2021-11-23 DIAGNOSIS — E782 Mixed hyperlipidemia: Secondary | ICD-10-CM

## 2021-11-23 NOTE — Telephone Encounter (Signed)
Requested medication (s) are due for refill today: yes ? ?Requested medication (s) are on the active medication list: yes ? ?Last refill:  09/10/21 #30 with 3 RF ? ?Future visit scheduled: 01/24/22 ? ?Notes to clinic:  Labs are from 2021, last OV more than 12 months ago, already had a 4 month refill, no upcoming appt, please assess. ? ? ?  ? ?Requested Prescriptions  ?Pending Prescriptions Disp Refills  ? pravastatin (PRAVACHOL) 40 MG tablet [Pharmacy Med Name: PRAVASTATIN SODIUM 40 MG TAB] 90 tablet 1  ?  Sig: TAKE 1 TABLET BY MOUTH EVERY DAY  ?  ? Cardiovascular:  Antilipid - Statins Failed - 11/23/2021 12:17 PM  ?  ?  Failed - Valid encounter within last 12 months  ?  Recent Outpatient Visits   ? ?      ? 1 year ago Annual physical exam  ? [facility] [place], Jodell Cipro, PA-C  ? 2 years ago Hyperlipidemia, mixed  ? [facility] [place], Jodell Cipro, PA-C  ? 4 years ago Fever, unspecified fever cause  ? [facility] [place], [place], [place]  ? 5 years ago Annual physical exam  ? [facility] [place], [place]  ? ?  ?  ?Future Appointments   ? ?        ? In 2 months Fisher, Demetrios Isaacs, MD [facility]  ? ?  ? ?  ?  ?  Failed - Lipid Panel in normal range within the last 12 months  ?  Cholesterol, Total  ?Date Value Ref Range Status  ?08/03/2020 173 100 - 199 mg/dL Final  ? ?LDL Chol Calc (NIH)  ?Date Value Ref Range Status  ?08/03/2020 118 (H) 0 - 99 mg/dL Final  ? ?HDL  ?Date Value Ref Range Status  ?08/03/2020 38 (L) >39 mg/dL Final  ? ?Triglycerides  ?Date Value Ref Range Status  ?08/03/2020 89 0 - 149 mg/dL Final  ? ?  ?  ?  Passed - Patient is not pregnant  ?  ?  ? ? ?

## 2022-01-20 ENCOUNTER — Telehealth: Payer: Self-pay | Admitting: Family Medicine

## 2022-01-20 DIAGNOSIS — Z125 Encounter for screening for malignant neoplasm of prostate: Secondary | ICD-10-CM

## 2022-01-20 DIAGNOSIS — Z1159 Encounter for screening for other viral diseases: Secondary | ICD-10-CM

## 2022-01-20 DIAGNOSIS — E782 Mixed hyperlipidemia: Secondary | ICD-10-CM

## 2022-01-20 NOTE — Telephone Encounter (Signed)
Pt is calling to request if orders can be placed prior to his cpe on Monday ?[phone] ?

## 2022-01-20 NOTE — Telephone Encounter (Signed)
That's fine, have entered pending orders. Please print and leave at lab. He needs to be fasting ?

## 2022-01-22 LAB — COMPREHENSIVE METABOLIC PANEL
ALT: 16 IU/L (ref 0–44)
AST: 21 IU/L (ref 0–40)
Albumin/Globulin Ratio: 2.3 — ABNORMAL HIGH (ref 1.2–2.2)
Albumin: 4.6 g/dL (ref 3.8–4.8)
Alkaline Phosphatase: 57 IU/L (ref 44–121)
BUN/Creatinine Ratio: 19 (ref 10–24)
BUN: 19 mg/dL (ref 8–27)
Bilirubin Total: 0.6 mg/dL (ref 0.0–1.2)
CO2: 24 mmol/L (ref 20–29)
Calcium: 9.3 mg/dL (ref 8.6–10.2)
Chloride: 105 mmol/L (ref 96–106)
Creatinine, Ser: 0.98 mg/dL (ref 0.76–1.27)
Globulin, Total: 2 g/dL (ref 1.5–4.5)
Glucose: 84 mg/dL (ref 70–99)
Potassium: 4.4 mmol/L (ref 3.5–5.2)
Sodium: 143 mmol/L (ref 134–144)
Total Protein: 6.6 g/dL (ref 6.0–8.5)
eGFR: 85 mL/min/{1.73_m2} (ref >59)

## 2022-01-22 LAB — LIPID PANEL
Chol/HDL Ratio: 4.4 ratio (ref 0.0–5.0)
Cholesterol, Total: 177 mg/dL (ref 100–199)
HDL: 40 mg/dL (ref >39)
LDL Chol Calc (NIH): 120 mg/dL — ABNORMAL HIGH (ref 0–99)
Triglycerides: 89 mg/dL (ref 0–149)
VLDL Cholesterol Cal: 17 mg/dL (ref 5–40)

## 2022-01-22 LAB — FPSA% REFLEX
% FREE PSA: 24.1 %
PSA, FREE: 1.06 ng/mL

## 2022-01-22 LAB — CBC
Hematocrit: 45.2 % (ref 37.5–51.0)
Hemoglobin: 15.5 g/dL (ref 13.0–17.7)
MCH: 30.2 pg (ref 26.6–33.0)
MCHC: 34.3 g/dL (ref 31.5–35.7)
MCV: 88 fL (ref 79–97)
Platelets: 203 10*3/uL (ref 150–450)
RBC: 5.14 x10E6/uL (ref 4.14–5.80)
RDW: 12.7 % (ref 11.6–15.4)
WBC: 5.5 10*3/uL (ref 3.4–10.8)

## 2022-01-22 LAB — PSA TOTAL (REFLEX TO FREE): Prostate Specific Ag, Serum: 4.4 ng/mL — ABNORMAL HIGH (ref 0.0–4.0)

## 2022-01-22 LAB — HEPATITIS C ANTIBODY: Hep C Virus Ab: NONREACTIVE

## 2022-01-24 ENCOUNTER — Ambulatory Visit (INDEPENDENT_AMBULATORY_CARE_PROVIDER_SITE_OTHER): Payer: Medicare Other | Admitting: Family Medicine

## 2022-01-24 ENCOUNTER — Encounter: Payer: Self-pay | Admitting: Family Medicine

## 2022-01-24 VITALS — BP 139/84 | HR 82 | Temp 97.9°F | Resp 14 | Ht 72.01 in | Wt 177.0 lb

## 2022-01-24 DIAGNOSIS — Z136 Encounter for screening for cardiovascular disorders: Secondary | ICD-10-CM

## 2022-01-24 DIAGNOSIS — Z0000 Encounter for general adult medical examination without abnormal findings: Secondary | ICD-10-CM

## 2022-01-24 DIAGNOSIS — Z87891 Personal history of nicotine dependence: Secondary | ICD-10-CM

## 2022-01-24 DIAGNOSIS — B0221 Postherpetic geniculate ganglionitis: Secondary | ICD-10-CM | POA: Insufficient documentation

## 2022-01-24 DIAGNOSIS — Z125 Encounter for screening for malignant neoplasm of prostate: Secondary | ICD-10-CM | POA: Diagnosis not present

## 2022-01-24 NOTE — Progress Notes (Signed)
? ? ?I,Ronald Neal,acting as a scribe for Ronald Huh, MD.,have documented all relevant documentation on the behalf of Ronald Huh, MD,as directed by  Ronald Huh, MD while in the presence of Ronald Huh, MD.  ? ? ?Medicare Initial Preventative Physical Exam ? ? ? ?Patient: Ronald Neal, Male    DOB: [birth year], 66 y.o.   MRN: [id number] ?Visit Date: 01/24/2022 ? ?Today's Provider: Lelon Huh, MD  ? ?Chief Complaint  ?Patient presents with  ? Hyperlipidemia  ? Medicare Wellness  ? ?Subjective  ?  ?Medicare Initial Preventative Physical Exam ?Ronald Neal is a 66 y.o. male who presents today for his Initial Preventative Physical Exam.  ? ?He had routine labs drawn last week remarkable only for mildly elevated PSA down from last year. ?Lab Results  ?Component Value Date  ? PSA1 4.4 (H) 01/21/2022  ? PSA1 4.8 (H) 08/03/2020  ? PSA1 3.7 10/16/2017  ? ? ?Free PSA of 24.1%  indicated low risk for prostate cancer and has no urinary symptoms.  ? ?He does admit to smoking around 1ppd for about 5 years back in the last 1970s.  ? ?Hyperlipidemia ?Pertinent negatives include no chest pain, myalgias or shortness of breath.  ? ?He is tolerating pravastatin well and had all labs checked last week as below.  ?Social History  ? ?Socioeconomic History  ? Marital status: Married  ?  Spouse name: Not on file  ? Number of children: Not on file  ? Years of education: Not on file  ? Highest education level: Not on file  ?Occupational History  ? Not on file  ?Tobacco Use  ? Smoking status: Former  ?  Types: Cigarettes  ?  Quit date: 09/12/1979  ?  Years since quitting: 42.3  ? Smokeless tobacco: Never  ?Substance and Sexual Activity  ? Alcohol use: No  ? Drug use: No  ? Sexual activity: Not on file  ?Other Topics Concern  ? Not on file  ?Social History Narrative  ? Not on file  ? ?Social Determinants of Health  ? ?Financial Resource Strain: Not on file  ?Food Insecurity: Not on file  ?Transportation Needs: Not on file   ?Physical Activity: Not on file  ?Stress: Not on file  ?Social Connections: Not on file  ?Intimate Partner Violence: Not on file  ? ? ?Past Medical History:  ?Diagnosis Date  ? Hypercholesteremia   ?  ? ?There are no problems to display for this patient. ? ? ?Past Surgical History:  ?Procedure Laterality Date  ? TONSILLECTOMY    ? VASECTOMY  1997  ? ? ?His family history includes Colon polyps in his father and mother; Diabetes in his son; Heart disease in his mother. ? ? ?Current Outpatient Medications:  ?  Coenzyme Q10 (CO Q-10) 100 MG CAPS, Take by mouth., Disp: , Rfl:  ?  Ginkgo Biloba 40 MG TABS, Take by mouth., Disp: , Rfl:  ?  ibuprofen (ADVIL) 600 MG tablet, Take 1 tablet (600 mg total) by mouth every 6 (six) hours as needed., Disp: 30 tablet, Rfl: 0 ?  Multiple Vitamin (MULTIVITAMIN) capsule, Take 1 capsule by mouth daily., Disp: , Rfl:  ?  neomycin-polymyxin-hydrocortisone (CORTISPORIN) 3.5-10000-1 OTIC suspension, Apply 1-2 drops daily after soaking and cover with bandaid, Disp: 10 mL, Rfl: 0 ?  Omega-3 Fatty Acids (FISH OIL) 1000 MG CAPS, Take by mouth., Disp: , Rfl:  ?  pravastatin (PRAVACHOL) 40 MG tablet, TAKE 1 TABLET BY MOUTH EVERY DAY, Disp: 90  tablet, Rfl: 0 ?  Roflumilast (ZORYVE) 0.3 % CREA, Apply 1 application topically daily., Disp: 60 g, Rfl: 2  ? ?Patient Care Team: ?Ronald Sons, MD as PCP - General (Family Medicine) ? ?Review of Systems  ?Constitutional:  Positive for activity change, appetite change, fatigue and unexpected weight change. Negative for chills and fever.  ?HENT:  Positive for hearing loss and tinnitus. Negative for congestion, ear pain, nosebleeds and trouble swallowing.   ?Eyes:  Negative for pain and visual disturbance.  ?Respiratory:  Negative for cough, chest tightness and shortness of breath.   ?Cardiovascular:  Negative for chest pain, palpitations and leg swelling.  ?Gastrointestinal:  Negative for abdominal pain, blood in stool, constipation, diarrhea, nausea and  vomiting.  ?Endocrine: Negative for polydipsia, polyphagia and polyuria.  ?Genitourinary:  Negative for dysuria and flank pain.  ?Musculoskeletal:  Negative for arthralgias, back pain, joint swelling, myalgias and neck stiffness.  ?Skin:  Negative for color change, rash and wound.  ?Neurological:  Negative for dizziness, tremors, seizures, speech difficulty, weakness, light-headedness and headaches.  ?Psychiatric/Behavioral:  Negative for behavioral problems, confusion, decreased concentration, dysphoric mood and sleep disturbance. The patient is not nervous/anxious.   ?All other systems reviewed and are negative. ? ?Last CBC ?Lab Results  ?Component Value Date  ? WBC 5.5 01/21/2022  ? HGB 15.5 01/21/2022  ? HCT 45.2 01/21/2022  ? MCV 88 01/21/2022  ? MCH 30.2 01/21/2022  ? RDW 12.7 01/21/2022  ? PLT 203 01/21/2022  ? ?Last metabolic panel ?Lab Results  ?Component Value Date  ? GLUCOSE 84 01/21/2022  ? NA 143 01/21/2022  ? K 4.4 01/21/2022  ? CL 105 01/21/2022  ? CO2 24 01/21/2022  ? BUN 19 01/21/2022  ? CREATININE 0.98 01/21/2022  ? EGFR 85 01/21/2022  ? CALCIUM 9.3 01/21/2022  ? PROT 6.6 01/21/2022  ? ALBUMIN 4.6 01/21/2022  ? LABGLOB 2.0 01/21/2022  ? AGRATIO 2.3 (H) 01/21/2022  ? BILITOT 0.6 01/21/2022  ? ALKPHOS 57 01/21/2022  ? AST 21 01/21/2022  ? ALT 16 01/21/2022  ? ?Last lipids ?Lab Results  ?Component Value Date  ? CHOL 177 01/21/2022  ? HDL 40 01/21/2022  ? LDLCALC 120 (H) 01/21/2022  ? TRIG 89 01/21/2022  ? CHOLHDL 4.4 01/21/2022  ? ?  ? Objective  ? ?  ?Vitals: BP 139/84 (BP Location: Left Arm, Patient Position: Sitting, Cuff Size: Normal)   Pulse 82   Temp 97.9 ?F (36.6 ?C) (Oral)   Resp 14   Ht 6' 0.01" (1.829 m)   Wt 177 lb (80.3 kg)   SpO2 100% Comment: room air  BMI 24.00 kg/m?  ?Vision Screening  ? Right eye Left eye Both eyes  ?Without correction     ?With correction 20/25 20/15 20/15  ?Comments: Patient saw all colors  ? ? ?Physical Exam ? ? ?General Appearance:    Well developed, well  nourished male. Alert, cooperative, in no acute distress, appears stated age  ?Head:    Normocephalic, without obvious abnormality, atraumatic  ?Eyes:    PERRL, conjunctiva/corneas clear, EOM's intact, fundi  ?  benign, both eyes       ?Ears:    Normal TM's and external ear canals, both ears  ?Nose:   Nares normal, septum midline, mucosa normal, no drainage ?  or sinus tenderness  ?Throat:   Lips, mucosa, and tongue normal; teeth and gums normal  ?Neck:   Supple, symmetrical, trachea midline, no adenopathy;     ?  thyroid:  No enlargement/tenderness/nodules; no carotid ?  bruit or JVD  ?Back:     Symmetric, no curvature, ROM normal, no CVA tenderness  ?Lungs:     Clear to auscultation bilaterally, respirations unlabored  ?Chest wall:    No tenderness or deformity  ?Heart:    Normal heart rate. Normal rhythm. No murmurs, rubs, or gallops.  S1 and S2 normal  ?Abdomen:     Soft, non-tender, bowel sounds active all four quadrants,  ?  no masses, no organomegaly  ?Genitalia:    deferred  ?Rectal:    deferred  ?Extremities:   All extremities are intact. No cyanosis or edema  ?Pulses:   2+ and symmetric all extremities  ?Skin:   Skin color, texture, turgor normal, no rashes or lesions  ?Lymph nodes:   Cervical, supraclavicular, and axillary nodes normal  ?Neurologic:   CNII-XII intact. Normal strength, sensation and reflexes    ?  throughout  ?  ? ?Activities of Daily Living ? ?  01/24/2022  ?  9:17 AM  ?In your present state of health, do you have any difficulty performing the following activities:  ?Hearing? 1  ?Vision? 0  ?Difficulty concentrating or making decisions? 1  ?Walking or climbing stairs? 0  ?Dressing or bathing? 0  ?Doing errands, shopping? 0  ? ? ?Fall Risk Assessment ? ?  01/24/2022  ?  9:17 AM 08/04/2020  ?  3:51 PM  ?Fall Risk   ?Falls in the past year? 0 0  ?Number falls in past yr: 0 0  ?Injury with Fall? 0 0  ?Risk for fall due to : No Fall Risks   ?Follow up Falls evaluation completed   ? ?   ?Depression Screen ? ?  01/24/2022  ?  9:16 AM 08/04/2020  ?  3:52 PM 05/17/2016  ? 10:55 AM  ?PHQ 2/9 Scores  ?PHQ - 2 Score 2 0 0  ?PHQ- 9 Score 8 3   ? ? ?   ? View : No data to display.  ?  ?  ?  ? ? ?No results found fo

## 2022-01-24 NOTE — Patient Instructions (Signed)
Please review the attached list of medications and notify my office if there are any errors.  ° °I recommend that you get the Prevnar 20 vaccine to protect yourself from certain dangerous strains of pneumonia. You can get Prevnar 20 at your pharmacy, or call our office at [phone] at your earliest convenience to schedule this vaccine.   °

## 2022-02-04 ENCOUNTER — Ambulatory Visit: Payer: Commercial Managed Care - PPO

## 2022-02-18 ENCOUNTER — Other Ambulatory Visit: Payer: Self-pay | Admitting: Family Medicine

## 2022-02-18 DIAGNOSIS — E782 Mixed hyperlipidemia: Secondary | ICD-10-CM

## 2022-04-25 ENCOUNTER — Ambulatory Visit
Admission: RE | Admit: 2022-04-25 | Discharge: 2022-04-25 | Disposition: A | Discharge status: Home / Self Care | Payer: Medicare Other | Source: Ambulatory Visit | Attending: Family Medicine | Admitting: Family Medicine

## 2022-04-25 DIAGNOSIS — Z87891 Personal history of nicotine dependence: Secondary | ICD-10-CM | POA: Insufficient documentation

## 2022-04-25 DIAGNOSIS — Z136 Encounter for screening for cardiovascular disorders: Secondary | ICD-10-CM | POA: Diagnosis present

## 2022-10-28 ENCOUNTER — Telehealth: Payer: Self-pay | Admitting: Family Medicine

## 2022-10-28 NOTE — Telephone Encounter (Signed)
Pt is calling to ask could Dr. Caryn Section prescribe LDN Low Dose Naltrex for brain fog, and energy concerns. Pt states that he discuss his sx with the provider at his last CPE.  Cb- [phone]

## 2022-11-01 NOTE — Telephone Encounter (Signed)
It's not covered by insurance and I don't know any pharmacies around here that carry it. But if knows of a pharmacy that has it he can make an appointment to talk about it.

## 2022-11-02 NOTE — Telephone Encounter (Signed)
Patient advised as below. He reports [facility] will compound medication. He said he would pay out of pocket for medication. He reports he will wait until his May appointment to go over medication.

## 2023-01-26 NOTE — Progress Notes (Deleted)
Annual Wellness Visit     Patient: Ronald Neal, Male    DOB: 10-15-1955, 67 y.o.   MRN: [id number] Visit Date: 01/27/2023  Today's Provider: Mila Merry, MD   No chief complaint on file.  Subjective    Ronald Neal is a 67 y.o. male who presents today for his Annual Wellness Visit. He reports consuming a {diet types:17450} diet. {Exercise:19826} He generally feels {well/fairly well/poorly:18703}. He reports sleeping {well/fairly well/poorly:18703}. He {does/does not:200015} have additional problems to discuss today.   HPI Lipid/Cholesterol, Follow-up  Last lipid panel Other pertinent labs  Lab Results  Component Value Date   CHOL 177 01/21/2022   HDL 40 01/21/2022   LDLCALC 120 (H) 01/21/2022   TRIG 89 01/21/2022   CHOLHDL 4.4 01/21/2022   Lab Results  Component Value Date   ALT 16 01/21/2022   AST 21 01/21/2022   PLT 203 01/21/2022   TSH 3.110 08/03/2020     He was last seen for this 1 years ago.  Management since that visit includes continue Pravastatin.  He reports {excellent/good/fair/poor:19665} compliance with treatment.   The 10-year ASCVD risk score (Arnett DK, et al., 2019) is: 18.9%  ---------------------------------------------------------------------------------------------------    Medications: Outpatient Medications Prior to Visit  Medication Sig   Coenzyme Q10 (CO Q-10) 100 MG CAPS Take by mouth.   Ginkgo Biloba 40 MG TABS Take by mouth.   ibuprofen (ADVIL) 600 MG tablet Take 1 tablet (600 mg total) by mouth every 6 (six) hours as needed.   Multiple Vitamin (MULTIVITAMIN) capsule Take 1 capsule by mouth daily.   neomycin-polymyxin-hydrocortisone (CORTISPORIN) 3.5-10000-1 OTIC suspension Apply 1-2 drops daily after soaking and cover with bandaid   Omega-3 Fatty Acids (FISH OIL) 1000 MG CAPS Take by mouth.   pravastatin (PRAVACHOL) 40 MG tablet TAKE 1 TABLET BY MOUTH EVERY DAY   Roflumilast (ZORYVE) 0.3 % CREA Apply 1 application topically  daily.   No facility-administered medications prior to visit.    No Known Allergies  Patient Care Team: Malva Limes, MD as PCP - General (Family Medicine) Bud Face, MD as Referring Physician (Otolaryngology)  Review of Systems  {Labs  Heme  Chem  Endocrine  Serology  Results Review (optional):23779}    Objective    Vitals: There were no vitals taken for this visit. {Show previous vital signs (optional):23777}   Physical Exam ***  Most recent functional status assessment:     No data to display         Most recent fall risk assessment:    01/24/2022    9:17 AM  Fall Risk   Falls in the past year? 0  Number falls in past yr: 0  Injury with Fall? 0  Risk for fall due to : No Fall Risks  Follow up Falls evaluation completed    Most recent depression screenings:    01/24/2022    9:16 AM 08/04/2020    3:52 PM  PHQ 2/9 Scores  PHQ - 2 Score 2 0  PHQ- 9 Score 8 3   Most recent cognitive screening:     No data to display         Most recent Audit-C alcohol use screening    01/24/2022    9:17 AM  Alcohol Use Disorder Test (AUDIT)  1. How often do you have a drink containing alcohol? 0  2. How many drinks containing alcohol do you have on a typical day when you are drinking? 0  3.  How often do you have six or more drinks on one occasion? 0  AUDIT-C Score 0   A score of 3 or more in women, and 4 or more in men indicates increased risk for alcohol abuse, EXCEPT if all of the points are from question 1   No results found for any visits on 01/27/23.  Assessment & Plan     Annual wellness visit done today including the all of the following: Reviewed patient's Family Medical History Reviewed and updated list of patient's medical providers Assessment of cognitive impairment was done Assessed patient's functional ability Established a written schedule for health screening services Health Risk Assessent Completed and Reviewed  Exercise  Activities and Dietary recommendations  Goals   None     Immunization History  Administered Date(s) Administered   Influenza-Unspecified 06/17/2019   Td 03/12/2008, 07/14/2012   Tdap 03/12/2008   Zoster, Live 06/06/2016    Health Maintenance  Topic Date Due   COVID-19 Vaccine (1) Never done   Zoster Vaccines- Shingrix (1 of 2) Never done   Pneumonia Vaccine 62+ Years old (1 of 1 - PCV) Never done   DTaP/Tdap/Td (4 - Td or Tdap) 07/14/2022   COLONOSCOPY (Pts 45-51yrs Insurance coverage will need to be confirmed)  09/10/2022   Medicare Annual Wellness (AWV)  01/25/2023   INFLUENZA VACCINE  04/13/2023   Hepatitis C Screening  Completed   HPV VACCINES  Aged Out     Discussed health benefits of physical activity, and encouraged him to engage in regular exercise appropriate for his age and condition.    ***  No follow-ups on file.     {provider attestation***:1}   Mila Merry, MD  [facility] [phone] (phone) [phone] (fax)  [facility]

## 2023-01-27 ENCOUNTER — Encounter: Payer: Self-pay | Admitting: Family Medicine

## 2023-03-30 IMAGING — MR MR BRAIN/TEMPORAL BONE/IAC
10 of 13 series · 25 of 48 positions shown · IV contrast (7.5ml Gadavist)
Comparison: No pertinent prior exams available for comparison.

CLINICAL DATA: Provided history: Dizziness. Additional history
provided by scanning technologist: Patient reports shingles on the
right, pain, dizziness, change in taste since [DATE]st.
Unexplained weight loss (10 pounds).

EXAM:
MRI HEAD WITHOUT AND WITH CONTRAST
TECHNIQUE: Multiplanar, multiecho pulse sequences of the brain and surrounding
structures were obtained without and with intravenous contrast.
CONTRAST:  7.5mL GADAVIST GADOBUTROL 1 MMOL/ML IV SOLN

[Series 5: T1 · sagittal · 5.0mm · 0.62mm/px · 1 of 25 slices shown (1 of 3)]
[im 1/25]
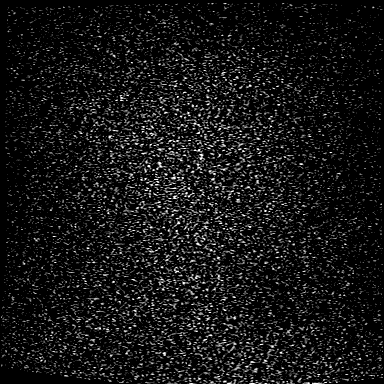

[Series 6: ax dwi_tracew · axial · 3.0mm · 0.60mm/px · z∈[-75,+79]mm · 4 of 48 slices shown]
[im 1/48]
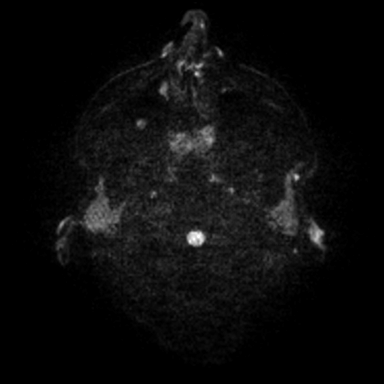
[im 16/48]
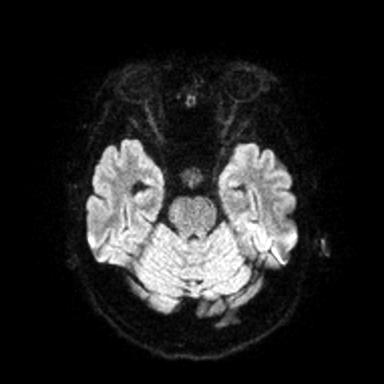
[im 32/48]
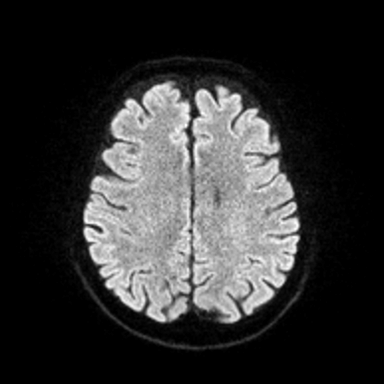
[im 48/48]
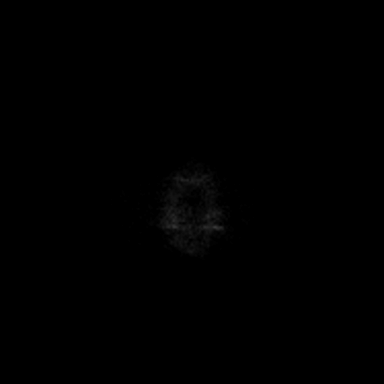

[Series 7: ax dwi_adc · axial · 3.0mm · 0.60mm/px · z∈[-75,-26]mm · 2 of 48 slices shown]
[im 1/48]
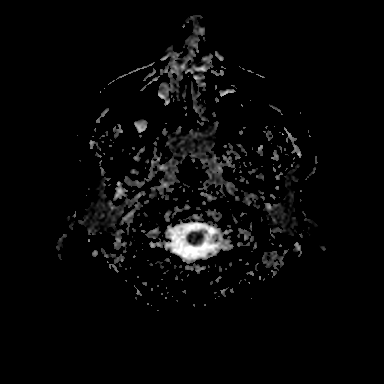
[im 16/48]
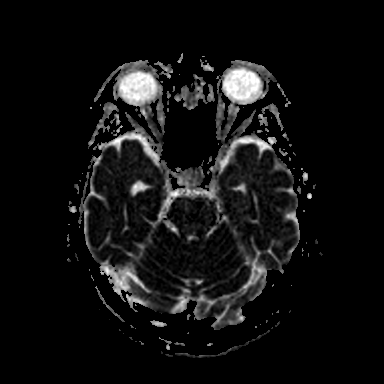

[Series 8: T2 · axial · 5.0mm · 0.53mm/px · z∈[-76,+78]mm · 2 of 27 slices shown]
[im 1/27]
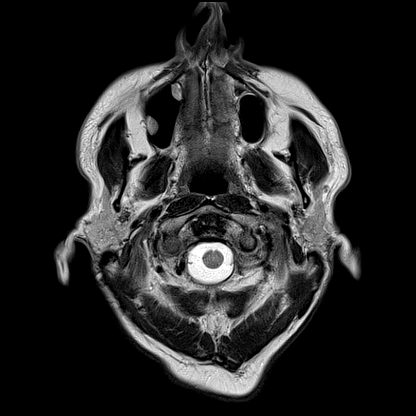
[im 27/27]
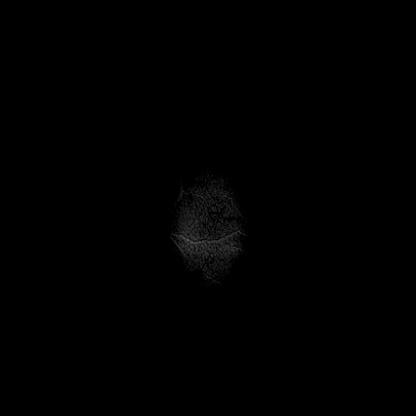

[Series 13: FLAIR · axial · 3.0mm · 0.53mm/px · z∈[-79,+81]mm · 4 of 55 slices shown]
[im 1/55]
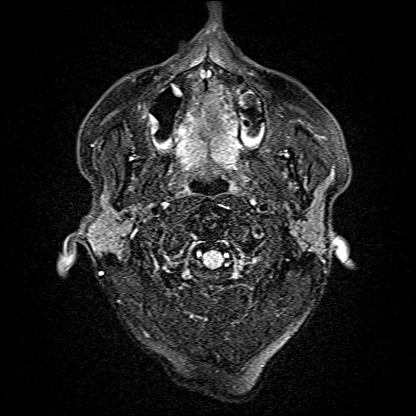
[im 19/55]
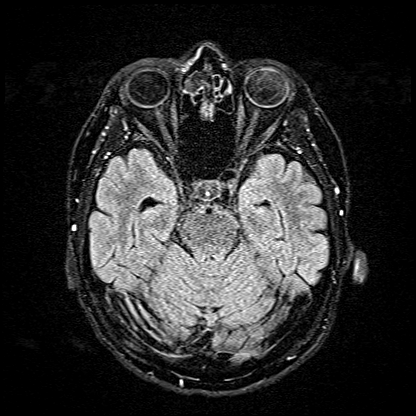
[im 37/55]
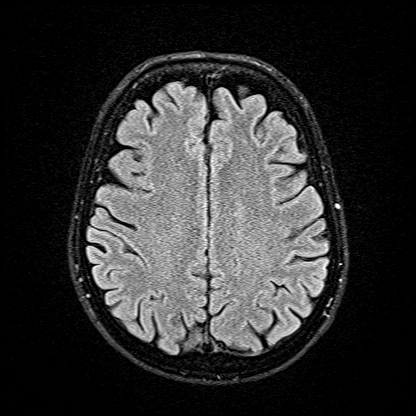
[im 55/55]
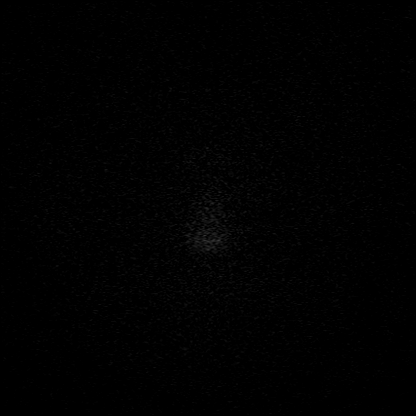

[Series 15: T1 · axial · non-contrast · 3.0mm · 0.21mm/px · 1 of 15 slices shown (2 of 3)]
[im 1/15]
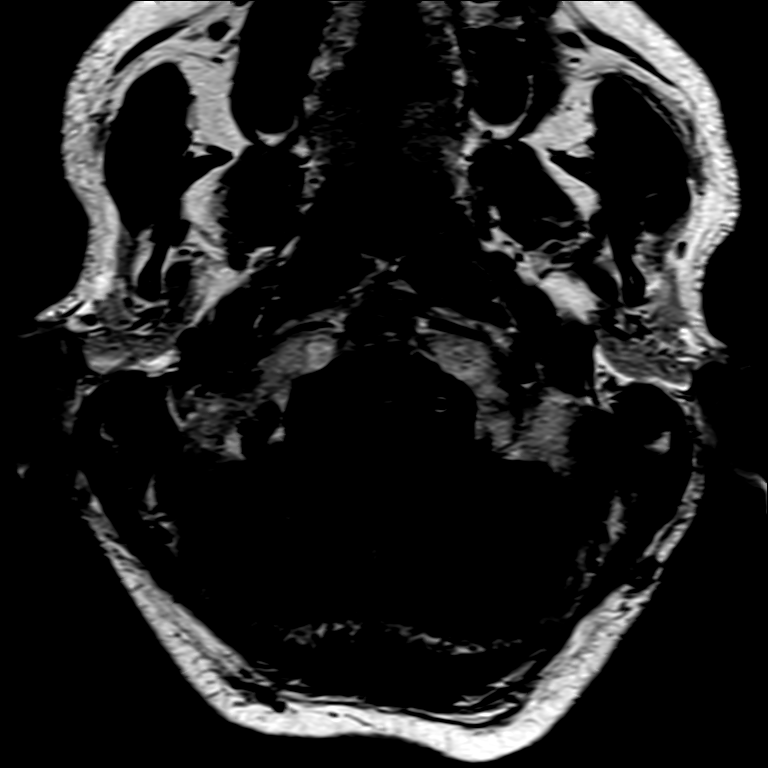

[Series 16: T1 · coronal · non-contrast · 3.0mm · 0.21mm/px · 1 of 13 slices shown (3 of 3)]
[im 1/13]
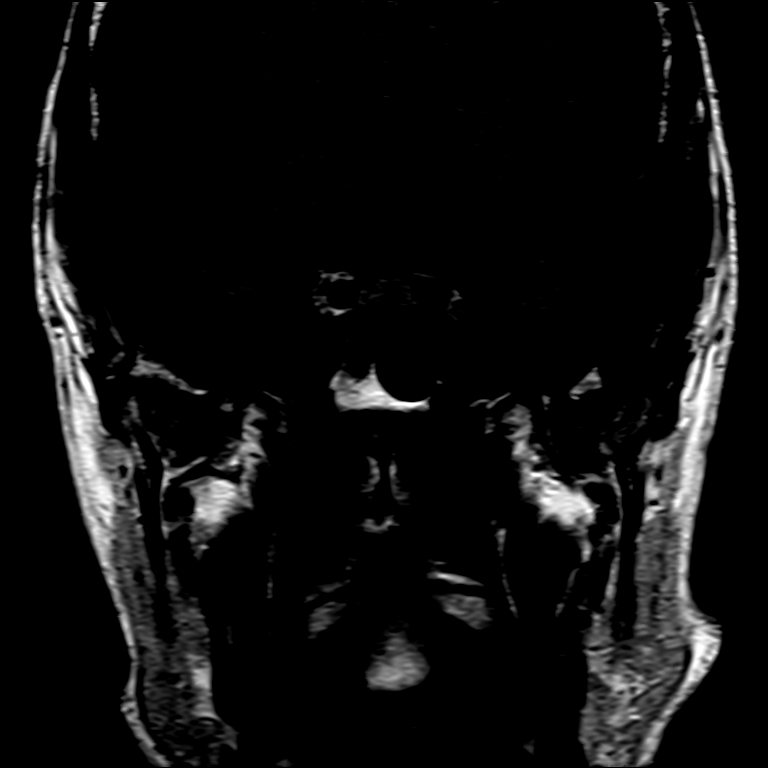

[Series 17: T1 post-contrast · axial · 3.0mm · 0.21mm/px · 1 of 15 slices shown (1 of 3)]
[im 1/15]
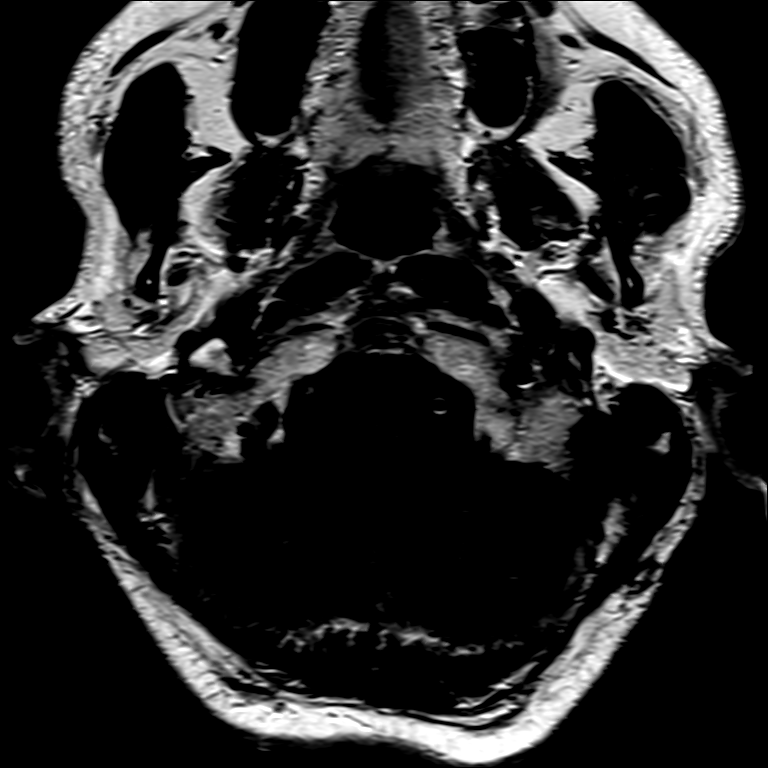

[Series 18: T1 post-contrast · coronal · 3.0mm · 0.21mm/px · 1 of 13 slices shown (2 of 3)]
[im 1/13]
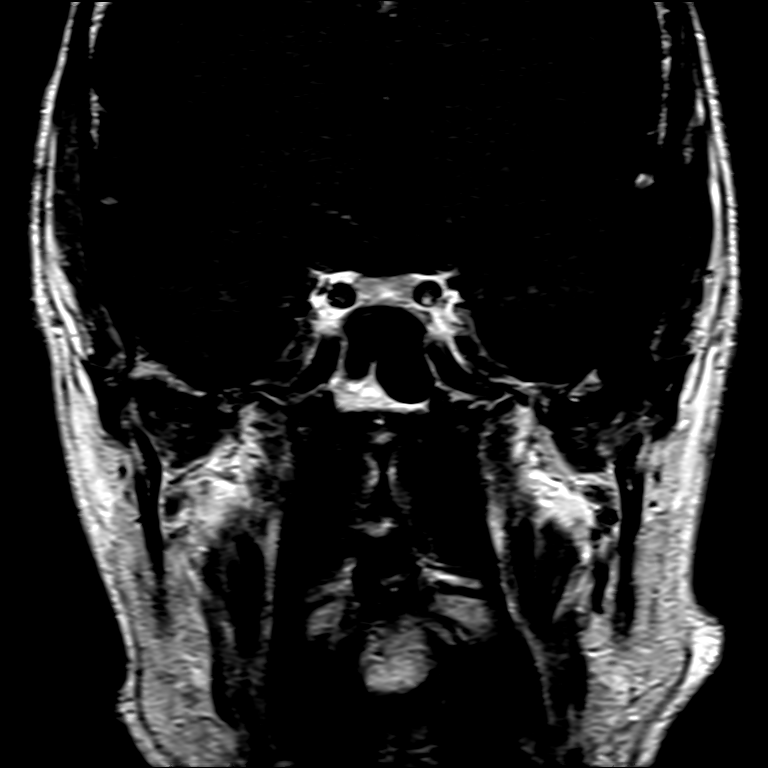

[Series 19: T1 post-contrast · axial · 1.0mm · 0.98mm/px · z∈[-84,+90]mm · 8 of 176 slices shown (3 of 3)]
[im 1/176]
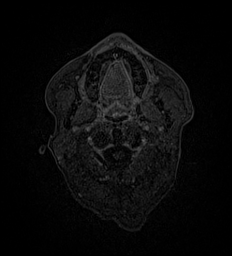
[im 27/176]
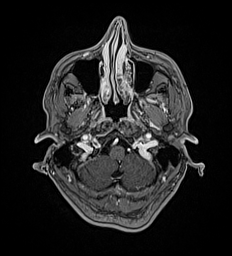
[im 54/176]
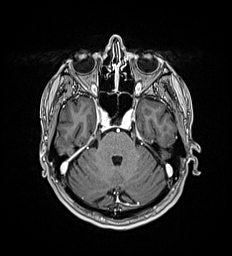
[im 81/176]
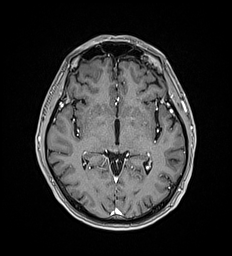
[im 95/176]
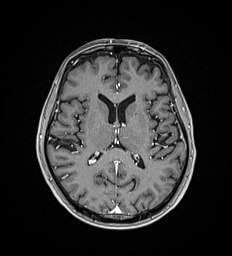
[im 122/176]
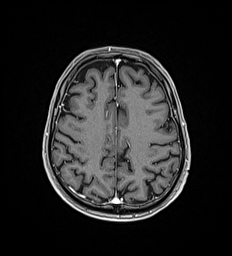
[im 149/176]
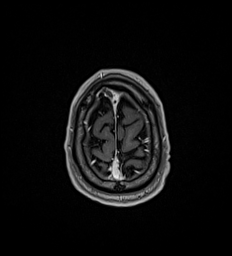
[im 176/176]
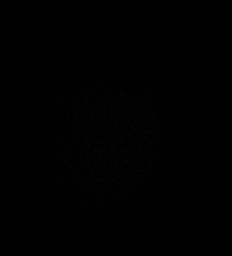

[25 of 48 positions shown; findings below may reference images not displayed]

FINDINGS: Brain:

Cerebral volume is normal.

There is abnormal curvilinear enhancement within the right internal
auditory canal fundus, both superiorly and inferiorly (best
appreciated on series 19, image 27) (series 18, image 7).

There are a few small scattered foci of T2 FLAIR hyperintense signal
abnormality within the cerebral white matter, nonspecific but likely
reflecting minimal changes of chronic small vessel ischemia.

There is no acute infarct.

No chronic intracranial blood products.

No extra-axial fluid collection.

No midline shift.

Vascular: Maintained flow voids within the proximal large arterial
vessels.

Skull and upper cervical spine: No focal suspicious marrow lesion.

Sinuses/Orbits: Visualized orbits show no acute finding. Small
mucous retention cysts within the right maxillary sinus. Trace
mucosal thickening within the left maxillary and bilateral ethmoid
sinuses.

Impression #1 will be called to the ordering clinician or
representative by the Radiologist Assistant, and communication
documented in the PACS or [REDACTED].
IMPRESSION: Abnormal linear enhancement within the right internal auditory canal
fundus both superiorly and inferiorly. Findings likely reflect right
cranial nerve VII and VIII neuritis. Correlate for signs/symptoms of
Quirijn Amazigh syndrome.

Minimal chronic small vessel ischemic changes within the cerebral
white matter.

Mild paranasal sinus disease, as described.

## 2023-04-05 ENCOUNTER — Other Ambulatory Visit: Payer: Self-pay | Admitting: Family Medicine

## 2023-04-05 DIAGNOSIS — E782 Mixed hyperlipidemia: Secondary | ICD-10-CM

## 2023-05-16 ENCOUNTER — Ambulatory Visit (INDEPENDENT_AMBULATORY_CARE_PROVIDER_SITE_OTHER): Payer: Medicare Other

## 2023-05-16 VITALS — BP 126/76 | Ht 71.0 in | Wt 182.6 lb

## 2023-05-16 DIAGNOSIS — Z23 Encounter for immunization: Secondary | ICD-10-CM | POA: Diagnosis not present

## 2023-05-16 DIAGNOSIS — Z0000 Encounter for general adult medical examination without abnormal findings: Secondary | ICD-10-CM

## 2023-05-16 NOTE — Patient Instructions (Addendum)
Ronald Neal , Thank you for taking time to come for your Medicare Wellness Visit. I appreciate your ongoing commitment to your health goals. Please review the following plan we discussed and let me know if I can assist you in the future.   Referrals/Orders/Follow-Ups/Clinician Recommendations: none  This is a list of the screening recommended for you and due dates:  Health Maintenance  Topic Date Due   Zoster (Shingles) Vaccine (1 of 2) 04/11/2004   Pneumonia Vaccine (1 of 1 - PCV) Never done   DTaP/Tdap/Td vaccine (4 - Td or Tdap) 07/14/2022   Colon Cancer Screening  09/10/2022   Flu Shot  04/13/2023   COVID-19 Vaccine (1 - 2023-24 season) Never done   Medicare Annual Wellness Visit  05/15/2024   Hepatitis C Screening  Completed   HPV Vaccine  Aged Out    Advanced directives: (Provided) Advance directive discussed with you today. I have provided a copy for you to complete at home and have notarized. Once this is complete, please bring a copy in to our office so we can scan it into your chart.  Paperwork given to pt  Next Medicare Annual Wellness Visit scheduled for next year: Yes 05/20/24 @ 3pm in person

## 2023-05-16 NOTE — Progress Notes (Signed)
Subjective:   Ronald Neal is a 67 y.o. male who presents for an Initial Medicare Annual Wellness Visit.  Visit Complete: In person  Patient Medicare AWV questionnaire was completed by the patient on (not done); I have confirmed that all information answered by patient is correct and no changes since this date.  Review of Systems    Cardiac Risk Factors include: advanced age (>51men, >29 women);male gender    Objective:    Today's Vitals   05/16/23 1404  BP: 126/76  Weight: 182 lb 9.6 oz (82.8 kg)  Height: 5\' 11"  (1.803 m)   Body mass index is 25.47 kg/m.     05/16/2023    2:29 PM 05/17/2016   10:30 AM  Advanced Directives  Does Patient Have a Medical Advance Directive? No No    Current Medications (verified) Outpatient Encounter Medications as of 05/16/2023  Medication Sig   Multiple Vitamin (MULTIVITAMIN) capsule Take 1 capsule by mouth daily.   Omega-3 Fatty Acids (FISH OIL) 1000 MG CAPS Take by mouth.   pravastatin (PRAVACHOL) 40 MG tablet Take 1 tablet (40 mg total) by mouth daily. Please schedule office visit before any future refills.   Coenzyme Q10 (CO Q-10) 100 MG CAPS Take by mouth. (Patient not taking: Reported on 05/16/2023)   Ginkgo Biloba 40 MG TABS Take by mouth. (Patient not taking: Reported on 05/16/2023)   ibuprofen (ADVIL) 600 MG tablet Take 1 tablet (600 mg total) by mouth every 6 (six) hours as needed. (Patient not taking: Reported on 05/16/2023)   neomycin-polymyxin-hydrocortisone (CORTISPORIN) 3.5-10000-1 OTIC suspension Apply 1-2 drops daily after soaking and cover with bandaid (Patient not taking: Reported on 05/16/2023)   Roflumilast (ZORYVE) 0.3 % CREA Apply 1 application topically daily. (Patient not taking: Reported on 05/16/2023)   No facility-administered encounter medications on file as of 05/16/2023.    Allergies (verified) Patient has no known allergies.   History: Past Medical History:  Diagnosis Date   Hypercholesteremia    Past Surgical  History:  Procedure Laterality Date   TONSILLECTOMY     VASECTOMY  1997   Family History  Problem Relation Age of Onset   Heart disease Mother    Colon polyps Mother    Colon polyps Father    Diabetes Son    Social History   Socioeconomic History   Marital status: Married    Spouse name: Not on file   Number of children: Not on file   Years of education: Not on file   Highest education level: Not on file  Occupational History   Not on file  Tobacco Use   Smoking status: Former    Current packs/day: 0.00    Average packs/day: 5.0 packs/day for 5.0 years (25.0 ttl pk-yrs)    Types: Cigarettes    Start date: 09/11/1974    Quit date: 09/12/1979    Years since quitting: 43.7   Smokeless tobacco: Never  Substance and Sexual Activity   Alcohol use: No   Drug use: No   Sexual activity: Not on file  Other Topics Concern   Not on file  Social History Narrative   Not on file   Social Determinants of Health   Financial Resource Strain: Low Risk  (05/16/2023)   Overall Financial Resource Strain (CARDIA)    Difficulty of Paying Living Expenses: Not hard at all  Food Insecurity: No Food Insecurity (05/16/2023)   Hunger Vital Sign    Worried About Running Out of Food in the Last Year:  Never true    Ran Out of Food in the Last Year: Never true  Transportation Needs: No Transportation Needs (05/16/2023)   PRAPARE - Administrator, Civil Service (Medical): No    Lack of Transportation (Non-Medical): No  Physical Activity: Sufficiently Active (05/16/2023)   Exercise Vital Sign    Days of Exercise per Week: 6 days    Minutes of Exercise per Session: 30 min  Stress: No Stress Concern Present (05/16/2023)   [employer] of Occupational Health - Occupational Stress Questionnaire    Feeling of Stress : Not at all  Social Connections: Moderately Integrated (05/16/2023)   Social Connection and Isolation Panel [NHANES]    Frequency of Communication with Friends and Family: Twice  a week    Frequency of Social Gatherings with Friends and Family: Once a week    Attends Religious Services: More than 4 times per year    Active Member of [employer] or Organizations: No    Attends Engineer, structural: Never    Marital Status: Married    Tobacco Counseling Counseling given: Not Answered   Clinical Intake:  Pre-visit preparation completed: No  Pain : No/denies pain     BMI - recorded: 25.47 Nutritional Status: BMI 25 -29 Overweight Nutritional Risks: None Diabetes: No  How often do you need to have someone help you when you read instructions, pamphlets, or other written materials from your doctor or pharmacy?: 1 - Never  Interpreter Needed?: No  Comments: lives with wife and 2 grown children Information entered by :: B.Favor Hackler,LPN   Activities of Daily Living    05/16/2023    2:30 PM  In your present state of health, do you have any difficulty performing the following activities:  Hearing? 1  Vision? 0  Difficulty concentrating or making decisions? 1  Walking or climbing stairs? 0  Dressing or bathing? 1  Doing errands, shopping? 0  Preparing Food and eating ? N  Using the Toilet? N  In the past six months, have you accidently leaked urine? N  Do you have problems with loss of bowel control? N  Managing your Medications? N  Managing your Finances? N  Housekeeping or managing your Housekeeping? N    Patient Care Team: Malva Limes, MD as PCP - General (Family Medicine) Bud Face, MD as Referring Physician (Otolaryngology)  Indicate any recent Medical Services you may have received from other than [facility] providers in the past year (date may be approximate).     Assessment:   This is a routine wellness examination for Ronald Neal.  Hearing/Vision screen Hearing Screening - Comments:: Adequate hearing except 65% in rt ear Vision Screening - Comments:: Adequate vision Dr Clydene Pugh  Dietary issues and exercise activities  discussed:     Goals Addressed             This Visit's Progress    Patient Stated       Pt wants to continue to be as healthy as you can      Depression Screen    05/16/2023    2:22 PM 01/24/2022    9:16 AM 08/04/2020    3:52 PM 05/17/2016   10:55 AM  PHQ 2/9 Scores  PHQ - 2 Score 1 2 0 0  PHQ- 9 Score 6 8 3      Fall Risk    05/16/2023    2:16 PM 01/24/2022    9:17 AM 08/04/2020    3:51 PM  Fall Risk  Falls in the past year? 0 0 0  Number falls in past yr: 0 0 0  Injury with Fall? 0 0 0  Risk for fall due to : No Fall Risks No Fall Risks   Follow up Education provided;Falls prevention discussed Falls evaluation completed     MEDICARE RISK AT HOME: Medicare Risk at Home Any stairs in or around the home?: Yes If so, are there any without handrails?: Yes Home free of loose throw rugs in walkways, pet beds, electrical cords, etc?: Yes Adequate lighting in your home to reduce risk of falls?: Yes Life alert?: No Use of a cane, walker or w/c?: No Grab bars in the bathroom?: Yes Shower chair or bench in shower?: Yes Elevated toilet seat or a handicapped toilet?: No  TIMED UP AND GO:  Was the test performed? Yes  Length of time to ambulate 10 feet: 10 sec Gait steady and fast without use of assistive device    Cognitive Function:        05/16/2023    2:32 PM  6CIT Screen  What Year? 0 points  What month? 0 points  What time? 0 points  Count back from 20 0 points  Months in reverse 0 points  Repeat phrase 0 points  Total Score 0 points    Immunizations Immunization History  Administered Date(s) Administered   Influenza-Unspecified 06/17/2019   Td 03/12/2008, 07/14/2012   Tdap 03/12/2008   Zoster, Live 06/06/2016    TDAP status: Up to date  Flu Vaccine status: Up to date  Pneumococcal vaccine status: Up to date pt says he thinks he has had this at pharmacy  Covid-19 vaccine status: Completed vaccines  Qualifies for Shingles Vaccine? Yes    Zostavax completed No   Shingrix Completed?: No.    Education has been provided regarding the importance of this vaccine. Patient has been advised to call insurance company to determine out of pocket expense if they have not yet received this vaccine. Advised may also receive vaccine at local pharmacy or Health Dept. Verbalized acceptance and understanding.  Screening Tests Health Maintenance  Topic Date Due   Zoster Vaccines- Shingrix (1 of 2) 04/11/2004   Pneumonia Vaccine 53+ Years old (1 of 1 - PCV) Never done   DTaP/Tdap/Td (4 - Td or Tdap) 07/14/2022   Colonoscopy  09/10/2022   INFLUENZA VACCINE  04/13/2023   COVID-19 Vaccine (1 - 2023-24 season) Never done   Medicare Annual Wellness (AWV)  05/15/2024   Hepatitis C Screening  Completed   HPV VACCINES  Aged Out    Health Maintenance  Health Maintenance Due  Topic Date Due   Zoster Vaccines- Shingrix (1 of 2) 04/11/2004   Pneumonia Vaccine 46+ Years old (1 of 1 - PCV) Never done   DTaP/Tdap/Td (4 - Td or Tdap) 07/14/2022   Colonoscopy  09/10/2022   INFLUENZA VACCINE  04/13/2023   COVID-19 Vaccine (1 - 2023-24 season) Never done    Colorectal cancer screening: Type of screening: Colonoscopy. Completed no. Repeat every 5-10 years Pt wants to wait  Lung Cancer Screening: (Low Dose CT Chest recommended if Age 18-80 years, 20 pack-year currently smoking OR have quit w/in 15years.) does not qualify.   Lung Cancer Screening Referral: no  Additional Screening:  Hepatitis C Screening: does not qualify; Completed yes  Vision Screening: Recommended annual ophthalmology exams for early detection of glaucoma and other disorders of the eye. Is the patient up to date with their annual eye exam?  Yes  Who is the provider or what is the name of the office in which the patient attends annual eye exams? Dr Clydene Pugh If pt is not established with a provider, would they like to be referred to a provider to establish care? No .   Dental  Screening: Recommended annual dental exams for proper oral hygiene  Diabetic Foot Exam: n/a  Community Resource Referral / Chronic Care Management: CRR required this visit?  No   CCM required this visit?  No Pt declined to schedule PE and flu vaccine says he has to pick up his wife at pharmacy and will get there (and Shingles vaccine)    Plan:     I have personally reviewed and noted the following in the patient's chart:   Medical and social history Use of alcohol, tobacco or illicit drugs  Current medications and supplements including opioid prescriptions. Patient is not currently taking opioid prescriptions. Functional ability and status Nutritional status Physical activity Advanced directives List of other physicians Hospitalizations, surgeries, and ER visits in previous 12 months Vitals Screenings to include cognitive, depression, and falls Referrals and appointments  In addition, I have reviewed and discussed with patient certain preventive protocols, quality metrics, and best practice recommendations. A written personalized care plan for preventive services as well as general preventive health recommendations were provided to patient.     Sue Lush, LPN   0/09/6008   After Visit Summary: (MyChart) Due to this being a telephonic visit, the after visit summary with patients personalized plan was offered to patient via MyChart PRINTED AND GIVEN TO PT  Nurse Notes: Pt relays he does not have a good appetite and has low energy. He expresses wanting to have more energy and better appetite. He relays he still suffers from a "swimming head" from shingles two years ago and would like to possibly go to a neurologist or ENT dr. In his research he has a question of whether he can take NAC-acetyl cysteine ( he says it is by prescription). I encouraged pt to make an appt to see his PCP. He dclined, He relays he will do later. Pt last PE 01/2022. Pt declined to schedule PE and get  flu vaccine says he has to leave and pick up his wife at pharmacy. He will get flu vaccine there (and Shingles vaccine).

## 2023-05-19 ENCOUNTER — Telehealth: Payer: Self-pay

## 2023-05-19 NOTE — Telephone Encounter (Signed)
Copied from CRM [id number]. Topic: Appointment Scheduling - Scheduling Inquiry for Clinic >> May 19, 2023  4:22 PM Marlow Baars wrote: Reason for CRM: The patient called wanting to schedule his physical he missed in May. He also wants to get updated blood work drawn and discuss a medication called NAC with his provider. He prefers late morning for his physical and early morning if he needs to fast for his labs. Please assist patient further

## 2023-06-15 ENCOUNTER — Telehealth: Payer: Self-pay | Admitting: Family Medicine

## 2023-06-15 DIAGNOSIS — Z125 Encounter for screening for malignant neoplasm of prostate: Secondary | ICD-10-CM

## 2023-06-15 DIAGNOSIS — E782 Mixed hyperlipidemia: Secondary | ICD-10-CM

## 2023-06-15 NOTE — Telephone Encounter (Signed)
Patient coming in for a cpe on 06/30/23 and wants to have labs done before his physical.  Please let patient know when lab req is ready.

## 2023-06-18 NOTE — Addendum Note (Signed)
Addended by: Malva Limes on: 06/18/2023 05:50 PM   Modules accepted: Orders

## 2023-06-18 NOTE — Telephone Encounter (Signed)
Order placed, needs to be fasting 

## 2023-06-20 NOTE — Telephone Encounter (Signed)
LMTCB-Ok for [facility] Nurse to give providers message

## 2023-06-21 NOTE — Telephone Encounter (Signed)
Patient has been notified

## 2023-06-26 ENCOUNTER — Other Ambulatory Visit: Payer: Self-pay | Admitting: Family Medicine

## 2023-06-26 DIAGNOSIS — E782 Mixed hyperlipidemia: Secondary | ICD-10-CM

## 2023-06-27 NOTE — Telephone Encounter (Signed)
Requested medications are due for refill today.  yes  Requested medications are on the active medications list.  yes  Last refill. 7/24/20224 #90 0 rf  Future visit scheduled.   yes  Notes to clinic.  Labs are expired.    Requested Prescriptions  Pending Prescriptions Disp Refills   pravastatin (PRAVACHOL) 40 MG tablet [Pharmacy Med Name: PRAVASTATIN SODIUM 40 MG TAB] 90 tablet 0    Sig: Take 1 tablet (40 mg total) by mouth daily. Please schedule office visit before any future refills.     Cardiovascular:  Antilipid - Statins Failed - 06/26/2023  8:30 AM      Failed - Valid encounter within last 12 months    Recent Outpatient Visits           1 year ago Encounter for initial preventive physical examination covered by [employer]   [facility] Malva Limes, MD   2 years ago Annual physical exam   [facility] Chrismon, Jodell Cipro, [place]   4 years ago Hyperlipidemia, mixed   [facility] Chrismon, Jodell Cipro, PA-C   5 years ago Fever, unspecified fever cause   [facility] [place], [place], [place]   7 years ago Annual physical exam   [facility] [place], [place], [place]       Future Appointments             In 3 days Fisher, Demetrios Isaacs, MD [facility]            Failed - Lipid Panel in normal range within the last 12 months    Cholesterol, Total  Date Value Ref Range Status  01/21/2022 177 100 - 199 mg/dL Final   LDL Chol Calc (NIH)  Date Value Ref Range Status  01/21/2022 120 (H) 0 - 99 mg/dL Final   HDL  Date Value Ref Range Status  01/21/2022 40 >39 mg/dL Final   Triglycerides  Date Value Ref Range Status  01/21/2022 89 0 - 149 mg/dL Final         Passed - Patient is not pregnant

## 2023-06-28 LAB — COMPREHENSIVE METABOLIC PANEL
ALT: 16 [IU]/L (ref 0–44)
AST: 18 [IU]/L (ref 0–40)
Albumin: 4.5 g/dL (ref 3.9–4.9)
Alkaline Phosphatase: 57 [IU]/L (ref 44–121)
BUN/Creatinine Ratio: 16 (ref 10–24)
BUN: 17 mg/dL (ref 8–27)
Bilirubin Total: 0.6 mg/dL (ref 0.0–1.2)
CO2: 26 mmol/L (ref 20–29)
Calcium: 9.2 mg/dL (ref 8.6–10.2)
Chloride: 106 mmol/L (ref 96–106)
Creatinine, Ser: 1.06 mg/dL (ref 0.76–1.27)
Globulin, Total: 1.7 g/dL (ref 1.5–4.5)
Glucose: 101 mg/dL — ABNORMAL HIGH (ref 70–99)
Potassium: 4.8 mmol/L (ref 3.5–5.2)
Sodium: 143 mmol/L (ref 134–144)
Total Protein: 6.2 g/dL (ref 6.0–8.5)
eGFR: 76 mL/min/{1.73_m2} (ref >59)

## 2023-06-28 LAB — CBC
Hematocrit: 45.6 % (ref 37.5–51.0)
Hemoglobin: 15.7 g/dL (ref 13.0–17.7)
MCH: 30.7 pg (ref 26.6–33.0)
MCHC: 34.4 g/dL (ref 31.5–35.7)
MCV: 89 fL (ref 79–97)
Platelets: 206 10*3/uL (ref 150–450)
RBC: 5.12 x10E6/uL (ref 4.14–5.80)
RDW: 12.9 % (ref 11.6–15.4)
WBC: 6.4 10*3/uL (ref 3.4–10.8)

## 2023-06-28 LAB — LIPID PANEL
Chol/HDL Ratio: 4.1 {ratio} (ref 0.0–5.0)
Cholesterol, Total: 164 mg/dL (ref 100–199)
HDL: 40 mg/dL (ref >39)
LDL Chol Calc (NIH): 108 mg/dL — ABNORMAL HIGH (ref 0–99)
Triglycerides: 82 mg/dL (ref 0–149)
VLDL Cholesterol Cal: 16 mg/dL (ref 5–40)

## 2023-06-28 LAB — PSA: Prostate Specific Ag, Serum: 5.1 ng/mL — ABNORMAL HIGH (ref 0.0–4.0)

## 2023-06-30 ENCOUNTER — Other Ambulatory Visit: Payer: Self-pay | Admitting: Family Medicine

## 2023-06-30 ENCOUNTER — Encounter: Payer: Self-pay | Admitting: Family Medicine

## 2023-06-30 ENCOUNTER — Ambulatory Visit (INDEPENDENT_AMBULATORY_CARE_PROVIDER_SITE_OTHER): Payer: Medicare Other | Admitting: Family Medicine

## 2023-06-30 VITALS — BP 141/94 | HR 81 | Temp 97.5°F | Ht 71.0 in | Wt 181.4 lb

## 2023-06-30 DIAGNOSIS — Z1211 Encounter for screening for malignant neoplasm of colon: Secondary | ICD-10-CM

## 2023-06-30 DIAGNOSIS — B0221 Postherpetic geniculate ganglionitis: Secondary | ICD-10-CM

## 2023-06-30 DIAGNOSIS — L409 Psoriasis, unspecified: Secondary | ICD-10-CM

## 2023-06-30 DIAGNOSIS — Z0000 Encounter for general adult medical examination without abnormal findings: Secondary | ICD-10-CM

## 2023-06-30 DIAGNOSIS — E782 Mixed hyperlipidemia: Secondary | ICD-10-CM | POA: Diagnosis not present

## 2023-06-30 DIAGNOSIS — R972 Elevated prostate specific antigen [PSA]: Secondary | ICD-10-CM

## 2023-06-30 MED ORDER — ZORYVE 0.3 % EX CREA: 1.0000 "application " | TOPICAL_CREAM | Freq: Every day | CUTANEOUS | 2 refills | Status: DC

## 2023-06-30 MED ORDER — PRAVASTATIN SODIUM 40 MG PO TABS
40.0000 mg | ORAL_TABLET | Freq: Every day | ORAL | 3 refills | Status: DC
Start: 2023-06-30 — End: 2024-07-17

## 2023-06-30 NOTE — Patient Instructions (Addendum)
I recommend that you get the Prevnar 20 vaccine to protect yourself from certain dangerous strains of pneumonia. You can get Prevnar 20 at your pharmacy, or call our office at [phone] at your earliest convenience to schedule this vaccine.   I recommend that you get a flu vaccine this year. Please call our office at [phone] at your earliest convenience to schedule a flu shot.   We will plan on contacting you in January to recheck your PSA

## 2023-06-30 NOTE — Telephone Encounter (Signed)
Requested Prescriptions  Pending Prescriptions Disp Refills   ZORYVE 0.3 % CREA [Pharmacy Med Name: ZORYVE 0.3% CREAM] 60 g 2    Sig: APPLY 1 APPLICATION TOPICALLY DAILY     Off-Protocol Failed - 06/30/2023  5:39 PM      Failed - Medication not assigned to a protocol, review manually.      Passed - Valid encounter within last 12 months    Recent Outpatient Visits           Today Annual physical exam   [facility] Malva Limes, MD   1 year ago Encounter for initial preventive physical examination covered by [facility]   [facility] Malva Limes, [facility]   2 years ago Annual physical exam   [facility] Chrismon, Jodell Cipro, PA-C   4 years ago Hyperlipidemia, mixed   [facility] Chrismon, Jodell Cipro, PA-C   5 years ago Fever, unspecified fever cause   [facility] [place], [place], [place]

## 2023-06-30 NOTE — Progress Notes (Addendum)
Established Patient   Patient: Ronald Neal   DOB: [birth year]   67 y.o. Male  MRN: [id number] Visit Date: 06/30/2023  Today's healthcare provider: Mila Merry, MD   No chief complaint on file.  Subjective    Discussed the use of AI scribe software for clinical note transcription with the patient, who gave verbal consent to proceed.  History of Present Illness   The patient, previously diagnosed with Ramsey-Hunt syndrome, presented for follow up of hyperlipidemia and to discuss the potential use of N-acetylcysteine (NAC) as a treatment option. He reported hearing about NAC as a treatment for Ramsey-Hunt syndrome and expressed interest in trying it, hoping it might alleviate his persistent "swimmy head" sensation. The patient noted that his hearing has improved significantly, and he is now able to drive. However, he still experiences some balance issues.  The patient also reported a history of psoriasis on his leg, which was successfully treated with aZoryve prescribed by a dermatologist in the past. He requested a refill of this medication.  In terms of general health, the patient mentioned a lack of physical energy and mental clarity as his main concerns. He is currently retired and is trying to focus more on diet and exercise.  The patient's recent lab results showed slightly high cholesterol levels and a slightly elevated PSA level. He reported no extraordinary urinary symptoms, but occasionally wakes up at night to urinate. The patient also mentioned that he has not yet received his flu shot or pneumonia vaccine for the year, but plans to do so in the near future.  The patient's blood pressure was checked and found to be slightly elevated, which he attributed to drinking a significant amount of coffee prior to the appointment. He also mentioned that he regularly monitors his blood pressure at home and it is usually within normal limits.  The patient has not had a colonoscopy since  December 2013 and was due for another one. However, due to personal circumstances, he expressed interest in the Cologuard test as an alternative. He reported no family history of colon cancer.     Lab Results  Component Value Date   CHOL 164 06/27/2023   HDL 40 06/27/2023   LDLCALC 108 (H) 06/27/2023   TRIG 82 06/27/2023   CHOLHDL 4.1 06/27/2023   Lab Results  Component Value Date   NA 143 06/27/2023   K 4.8 06/27/2023   CREATININE 1.06 06/27/2023   EGFR 76 06/27/2023   GLUCOSE 101 (H) 06/27/2023   Lab Results  Component Value Date   PSA1 5.1 (H) 06/27/2023   PSA1 4.4 (H) 01/21/2022   PSA1 4.8 (H) 08/03/2020   He is noted to have had elevated PSA the last several years which was being monitored by Dortha Kern as he is not interested in invasive testing unless there is an alarming change in the PSA.       Family Status  Relation Name Status   Mother  Alive   Father  Deceased at age 76       blood disorder   Sister  Nature conservation officer   Daughter  Alive   Son  Alive   Sister  Alive  No partnership data on file   Family History  Problem Relation Age of Onset   Heart disease Mother    Colon polyps Mother    Colon polyps Father    Diabetes Son    No Known Allergies  Patient Care Team:  Malva Limes, MD as PCP - General (Family Medicine) Bud Face, MD as Referring Physician (Otolaryngology) [facility], [facility]   Medications: Outpatient Medications Prior to Visit  Medication Sig   Multiple Vitamin (MULTIVITAMIN) capsule Take 1 capsule by mouth daily.   Omega-3 Fatty Acids (FISH OIL) 1000 MG CAPS Take by mouth.   pravastatin (PRAVACHOL) 40 MG tablet Take 1 tablet (40 mg total) by mouth daily. Please schedule office visit before any future refills.   Roflumilast (ZORYVE) 0.3 % CREA Apply 1 application topically daily.   Coenzyme Q10 (CO Q-10) 100 MG CAPS Take by mouth. (Patient not taking: Reported on 05/16/2023)   Ginkgo Biloba 40 MG TABS  Take by mouth. (Patient not taking: Reported on 05/16/2023)   ibuprofen (ADVIL) 600 MG tablet Take 1 tablet (600 mg total) by mouth every 6 (six) hours as needed. (Patient not taking: Reported on 05/16/2023)   neomycin-polymyxin-hydrocortisone (CORTISPORIN) 3.5-10000-1 OTIC suspension Apply 1-2 drops daily after soaking and cover with bandaid (Patient not taking: Reported on 05/16/2023)   No facility-administered medications prior to visit.      Objective    BP (!) 141/94   Pulse 81   Temp (!) 97.5 F (36.4 C) (Oral)   Ht 5\' 11"  (1.803 m)   Wt 181 lb 6.4 oz (82.3 kg)   SpO2 100%   BMI 25.30 kg/m    Physical Exam    General: Appearance:    Well developed, well nourished male in no acute distress  Eyes:    PERRL, conjunctiva/corneas clear, EOM's intact       Lungs:     Clear to auscultation bilaterally, respirations unlabored  Heart:    Normal heart rate. Normal rhythm. No murmurs, rubs, or gallops.    MS:   All extremities are intact.    Neurologic:   Awake, alert, oriented x 3. No apparent focal neurological defect.    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 Patient interested in N-acetylcysteine (NAC) as a potential treatment. -Prescribe NAC 1200mg  daily for trial period of 3 months.  Hyperlipidemia Slightly elevated cholesterol levels, but improved from previous year. -Continue Pravastatin, refill prescription.  Prostate Health Slightly elevated PSA levels from his baseline, no urinary symptoms reported. -Plan to recheck PSA in 3 months to monitor for any upward trend.  Psoriasis Previous history of psoriasis on leg, effectively treated with Zoryve by his former dermatologist. He request refill to avoid going back to dermatologist.   -Recommend colon cancer screening with Cologuard test. -Advise patient to receive flu shot and pneumonia vaccine at local pharmacy. -Consider RSV vaccine  based on patient's age and insurance coverage. -Follow-up in January for recheck of PSA.          Mila Merry, MD  [facility] [phone] (phone) [phone] (fax)  [facility]

## 2023-07-03 ENCOUNTER — Other Ambulatory Visit: Payer: Self-pay | Admitting: Family Medicine

## 2023-07-03 DIAGNOSIS — L409 Psoriasis, unspecified: Secondary | ICD-10-CM

## 2023-07-03 NOTE — Telephone Encounter (Signed)
Requested medication (s) are due for refill today: yes,alternative requested  Requested medication (s) are on the active medication list: yes  Last refill:  06/30/23  Future visit scheduled: yes  Notes to clinic:    Pharmacy comment: Alternative Requested:NOT COVERED.      Requested Prescriptions  Pending Prescriptions Disp Refills   ZORYVE 0.3 % CREA [Pharmacy Med Name: ZORYVE 0.3% CREAM] 60 g 2    Sig: Apply 1 application  topically daily.     Off-Protocol Failed - 06/30/2023  8:12 PM      Failed - Medication not assigned to a protocol, review manually.      Passed - Valid encounter within last 12 months    Recent Outpatient Visits           3 days ago Annual physical exam   [place] [facility] Malva Limes, [facility]   1 year ago Encounter for initial preventive physical examination [facility]   [facility] Malva Limes, MD   2 years ago Annual physical exam   [facility] Chrismon, Jodell Cipro, PA-C   4 years ago Hyperlipidemia, mixed    [facility] Chrismon, Jodell Cipro, PA-C   5 years ago Fever, unspecified fever cause   [facility] [place], [place], [place]

## 2023-07-10 ENCOUNTER — Ambulatory Visit: Payer: Self-pay | Admitting: *Deleted

## 2023-07-10 ENCOUNTER — Telehealth: Payer: Self-pay | Admitting: Family Medicine

## 2023-07-10 DIAGNOSIS — B0221 Postherpetic geniculate ganglionitis: Secondary | ICD-10-CM

## 2023-07-10 NOTE — Telephone Encounter (Signed)
  Chief Complaint: NAC 1200mg - patient would like Rx sent to pharmacy  Disposition: [] ED /[] Urgent Care (no appt availability in office) / [] Appointment(In office/virtual)/ []  [place] [facility]/ [] Home Care/ [] Refused Recommended Disposition /[] [place] [facility]/ [x]  Follow-up with PCP Additional Notes: Also-  Roflumilast (ZORYVE) 0.3 % CREA not covered- is there something else?

## 2023-07-10 NOTE — Telephone Encounter (Signed)
Summary: NAC ?   The patient called in stating he would like to talk with his provider about a medication they talked about and thought he was to be prescribed. He says it is called NAC and he requested the prescription not the supplement so he could be reimbursed. He states he believes it should be 1,200 mg. Please assist patient further.     Reason for Disposition . [1] Caller has NON-URGENT medicine question about med that PCP prescribed AND [2] triager unable to answer question . Caller wants to use a complementary or alternative medicine  Answer Assessment - Initial Assessment Questions 1. NAME of MEDICINE: "What medicine(s) are you calling about?"     NAC 1200mg   2. QUESTION: "What is your question?" (e.g., double dose of medicine, side effect)     Patient would like to try 3. PRESCRIBER: "Who prescribed the medicine?" Reason: if prescribed by specialist, call should be referred to that group.     PCP  Also: Roflumilast (ZORYVE) 0.3 % CREA- not covered- is there something else?  Protocols used: Medication Question Call-A-AH

## 2023-07-10 NOTE — Telephone Encounter (Signed)
Received a fax from covermymeds for Zoryve 0.3% cream  Key: [id number]

## 2023-07-11 MED ORDER — NAC 600 MG PO CAPS: 1200.0000 mg | ORAL_CAPSULE | Freq: Every day | ORAL | 12 refills | Status: DC

## 2023-07-11 NOTE — Addendum Note (Signed)
Addended by: Malva Limes on: 07/11/2023 04:16 PM   Modules accepted: Orders

## 2023-07-11 NOTE — Telephone Encounter (Signed)
Have sent prescription for NAC to [facility]. Don't know of alternatives to ZORYVE . Recommend he go back to his dermatologist Dr. Gwen Pounds.

## 2023-07-12 NOTE — Telephone Encounter (Signed)
NA- Will try later. If patient call back ok for [facility] Nurse to give provider's message to patient.

## 2023-07-14 NOTE — Telephone Encounter (Signed)
LMTCB-ok for [facility] Nurse to advise

## 2023-07-17 ENCOUNTER — Telehealth: Payer: Self-pay

## 2023-07-17 NOTE — Telephone Encounter (Signed)
Have left message. Please se NT from 10/28 why we are calling patient. There is a message.

## 2023-07-17 NOTE — Telephone Encounter (Signed)
Copied from CRM [id number]. Topic: General - Other >> Jul 14, 2023  3:28 PM Santiya F wrote: Reason for CRM: Pt is calling in returning a call from the office. Pt says there was no voicemail left so he isn't sure who called.

## 2023-07-18 DIAGNOSIS — L409 Psoriasis, unspecified: Secondary | ICD-10-CM | POA: Insufficient documentation

## 2023-07-18 NOTE — Telephone Encounter (Signed)
Per provider medication was denied.  "it was originally prescribed by a dermatologist years ago and he asked me if I could refill it because he didn't want to go back to the dermatologist. when it was denied I told him he needs to go back to the dermatologist "

## 2023-07-19 LAB — COLOGUARD: COLOGUARD: NEGATIVE

## 2023-07-19 NOTE — Telephone Encounter (Signed)
Pt. Given message and verbalizes understanding. 

## 2023-07-21 NOTE — Addendum Note (Signed)
Addended by: Mila Merry E on: 07/21/2023 12:57 PM   Modules accepted: Level of Service

## 2023-09-20 ENCOUNTER — Telehealth: Payer: Self-pay

## 2023-09-20 NOTE — Telephone Encounter (Signed)
 Copied from CRM [id number]. Topic: General - Other >> Sep 20, 2023  3:32 PM [name] wrote: Reason for CRM: The patient shares that they have recently been billed for $122.19 for a date of service 06/30/23 which the patient feels should've been billed as a physical   The patient would like to speak with a member of staff further when possible about their billing concern   Please contact when available

## 2023-09-29 NOTE — Telephone Encounter (Signed)
Pt is calling to speak to the office manager regarding bill for DOS 06/30/23 for $122.19. Pt states that this was his CPE and should not have been billed. BC he had is lab work the day before. During the OV the provider went over his lab work. Apt was coded as a OV. Pt is disappointed that he called about this on 09/19/22 and has not received a call back. Pt states that the bill will be turned over to collections. Please advise CB- [phone]

## 2023-11-01 NOTE — Telephone Encounter (Signed)
The appointment was a visit for chronic conditions.

## 2024-06-06 ENCOUNTER — Encounter: Payer: Self-pay | Admitting: Nurse Practitioner

## 2024-06-06 ENCOUNTER — Ambulatory Visit: Admitting: Nurse Practitioner

## 2024-06-06 VITALS — BP 134/80 | HR 69 | Temp 97.5°F | Ht 70.0 in | Wt 174.0 lb

## 2024-06-06 DIAGNOSIS — Z87891 Personal history of nicotine dependence: Secondary | ICD-10-CM

## 2024-06-06 DIAGNOSIS — E782 Mixed hyperlipidemia: Secondary | ICD-10-CM | POA: Diagnosis not present

## 2024-06-06 DIAGNOSIS — R42 Dizziness and giddiness: Secondary | ICD-10-CM

## 2024-06-06 DIAGNOSIS — R972 Elevated prostate specific antigen [PSA]: Secondary | ICD-10-CM

## 2024-06-06 DIAGNOSIS — R5383 Other fatigue: Secondary | ICD-10-CM

## 2024-06-06 DIAGNOSIS — B0221 Postherpetic geniculate ganglionitis: Secondary | ICD-10-CM

## 2024-06-06 DIAGNOSIS — Z7689 Persons encountering health services in other specified circumstances: Secondary | ICD-10-CM | POA: Insufficient documentation

## 2024-06-06 DIAGNOSIS — H6121 Impacted cerumen, right ear: Secondary | ICD-10-CM

## 2024-06-06 DIAGNOSIS — Z125 Encounter for screening for malignant neoplasm of prostate: Secondary | ICD-10-CM

## 2024-06-06 NOTE — Patient Instructions (Signed)
 Nice to see you today Let me know if you want to see a neurologist Make a fasting lab appointment over the next week Make a follow up with me in 1 year, sooner if you need me

## 2024-06-06 NOTE — Assessment & Plan Note (Addendum)
 History of the same.  Fasting of the next week.  Patient on pravastatin  40 mg.  Patient would like to decrease dose to 20 mg pending lipids prior to making a decision.  Patient also on omega-3 fatty acids

## 2024-06-06 NOTE — Assessment & Plan Note (Signed)
 Did review patient's most recent office visit with previous PCP

## 2024-06-06 NOTE — Assessment & Plan Note (Signed)
 Pending repeat PSA not currently followed by urology

## 2024-06-06 NOTE — Progress Notes (Signed)
 New Patient Office Visit  Subjective    Patient ID: Ronald Neal, male    DOB: 21-Mar-1956  Age: 68 y.o. 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 Pt complains of having a swimmy head feeling that is constant.     HPI Ronald Neal presents to establish care   HLD: states that he hits and missed on the cholesterol medication   Ramsay Hunt Syndrome: right ear. States that around christmas 2022 and then ENT 2023. He has tried some PT and hearing aid.  He is able to function he has thought about seeing a neurologist but has not made that decision yet.   Elevated PSA: History of same.  Patient had 2 elevated PSAs.  No urinary symptoms Colonoscopy: cologuard 07/11/2023 that was negative   Tdap: 2013 Flu: update today  Covid: can get at local pharmacy  Pna: unsure  Shingles: zostavax  Sleep: goes to bed aroun 113- and get up around 830. States that he sometimes feels rested  He will walk 30 mins a day.      Outpatient Encounter Medications as of 06/06/2024  Medication Sig   Omega-3 Fatty Acids (FISH OIL) 1000 MG CAPS Take by mouth.   pravastatin  (PRAVACHOL ) 40 MG tablet Take 1 tablet (40 mg total) by mouth daily.   [DISCONTINUED] Acetylcysteine (NAC) 600 MG CAPS Take 2 capsules (1,200 mg total) by mouth daily.   [DISCONTINUED] Ginkgo Biloba 40 MG TABS Take by mouth. (Patient not taking: Reported on 05/16/2023)   [DISCONTINUED] Multiple Vitamin (MULTIVITAMIN) capsule Take 1 capsule by mouth daily.   [DISCONTINUED] Roflumilast  (ZORYVE ) 0.3 % CREA Apply 1 application  topically daily.   No facility-administered encounter medications on file as of 06/06/2024.    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    Past Surgical History:  Procedure Laterality Date   TONSILLECTOMY      VASECTOMY  1997    Family History  Problem Relation Age of Onset   Cancer Mother        lung ca   Heart disease Mother    Colon polyps Mother    Colon polyps Father    Clotting disorder Father    Diabetes Son     Social History   Socioeconomic History   Marital status: Married    Spouse name: Barnie   Number of children: Not on file   Years of education: Not on file   Highest education level: Associate degree: occupational, Scientist, product/process development, or vocational program  Occupational History   Not on file  Tobacco Use   Smoking status: Former    Current packs/day: 0.00    Average packs/day: 5.0 packs/day for 5.0 years (25.0 ttl pk-yrs)    Types: Cigarettes    Start date: 09/11/1974    Quit date: 09/12/1979    Years since quitting: 44.7   Smokeless tobacco: Never  Vaping Use   Vaping status: Never Used  Substance and Sexual Activity   Alcohol use: No   Drug use: No   Sexual activity: Not on file  Other Topics Concern   Not on file  Social History Narrative   Retired since 2023   States that he reitred from [employer]    Social Drivers of [employer]  Strain: Patient Declined (06/28/2023)   Overall Financial Resource Strain (CARDIA)    Difficulty of Paying Living Expenses: Patient declined  Food Insecurity: Patient Declined (06/28/2023)   Hunger Vital Sign    Worried About Running Out of Food in the Last Year: Patient declined    Ran Out of Food in the Last Year: Patient declined  Transportation Needs: No Transportation Needs (06/28/2023)   PRAPARE - Administrator, Civil Service (Medical): No    Lack of Transportation (Non-Medical): No  Physical Activity: Sufficiently Active (06/28/2023)   Exercise Vital Sign    Days of Exercise per Week: 5 days    Minutes of Exercise per Session: 30 min  Stress: Patient Declined (06/28/2023)   [employer] of Occupational Health - Occupational Stress Questionnaire    Feeling of Stress : Patient  declined  Social Connections: Unknown (06/28/2023)   Social Connection and Isolation Panel    Frequency of Communication with Friends and Family: Patient declined    Frequency of Social Gatherings with Friends and Family: Patient declined    Attends Religious Services: More than 4 times per year    Active Member of [employer] or Organizations: Yes    Attends Banker Meetings: Patient declined    Marital Status: Married  Catering manager Violence: Not At Risk (05/16/2023)   Humiliation, Afraid, Rape, and Kick questionnaire    Fear of Current or Ex-Partner: No    Emotionally Abused: No    Physically Abused: No    Sexually Abused: No    Review of Systems  Constitutional:  Negative for chills and fever.  Respiratory:  Negative for shortness of breath.   Cardiovascular:  Negative for chest pain and leg swelling.  Gastrointestinal:  Negative for abdominal pain, blood in stool, constipation, diarrhea, nausea and vomiting.       BM daily  Genitourinary:  Negative for dysuria and hematuria.       Nocturia x1  Neurological:  Positive for dizziness (swimmy headedness/ heaviness). Negative for tingling and headaches.  Psychiatric/Behavioral:  Negative for hallucinations and suicidal ideas.         Objective    BP 134/80   Pulse 69   Temp (!) 97.5 F (36.4 C) (Oral)   Ht 5' 10 (1.778 m)   Wt 174 lb (78.9 kg)   SpO2 98%   BMI 24.97 kg/m   Physical Exam Vitals and nursing note reviewed.  Constitutional:      Appearance: Normal appearance.  HENT:     Right Ear: Ear canal and external ear normal. There is impacted cerumen.     Left Ear: Tympanic membrane, ear canal and external ear normal.     Mouth/Throat:     Mouth: Mucous membranes are moist.     Pharynx: Oropharynx is clear.  Eyes:     Extraocular Movements: Extraocular movements intact.     Pupils: Pupils are equal, round, and reactive to light.  Cardiovascular:     Rate and Rhythm: Normal rate and regular rhythm.      Pulses: Normal pulses.     Heart sounds: Normal heart sounds.  Pulmonary:     Effort: Pulmonary effort is normal.     Breath sounds: Normal breath sounds.  Abdominal:     General: Bowel sounds are normal. There is no distension.     Palpations: There is no mass.     Tenderness: There is no abdominal tenderness.     Hernia: No hernia is present.  Musculoskeletal:     Right lower leg: No edema.     Left lower leg: No edema.  Lymphadenopathy:     Cervical: No cervical adenopathy.  Skin:    General: Skin is warm.  Neurological:     General: No focal deficit present.     Mental Status: He is alert.     Deep Tendon Reflexes:     Reflex Scores:      Bicep reflexes are 2+ on the right side and 2+ on the left side.      Patellar reflexes are 2+ on the right side and 2+ on the left side.    Comments: Bilateral upper and lower extremity strength 5/5  Psychiatric:        Mood and Affect: Mood normal.        Behavior: Behavior normal.        Thought Content: Thought content normal.        Judgment: Judgment normal.         Assessment & Plan:   Problem List Items Addressed This Visit       Nervous and Auditory   Ramsay Hunt syndrome (geniculate herpes zoster)   History of same was evaluated by [facility] with MRI that showed neuritis to cranial nerve VII and VIII.  Patient has done rehab exercises and wears a hearing aid with modest relief.  Patient is consider seeing neurology in regards to the lightheadedness but does not want to do it yet.  Patient can reach out to me with a referral to neurology and plan time.  Neurological exam benign in office today.      Impacted cerumen of right ear   Relevant Orders   Ear Lavage     Other   Hyperlipidemia, mixed   History of the same.  Fasting of the next week.  Patient on pravastatin  40 mg.  Patient would like to decrease dose to 20 mg pending lipids prior to making a decision.  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  Patient is functioning neurological exam benign.  Stable at this juncture      Relevant Orders   CBC   Comprehensive metabolic panel with GFR   Former smoker   Remote history of smoking.  Pending urine microscopy rule out microscopic hematuria      Relevant Orders   Urine Microscopic   Fatigue   Ambiguous pending labs inclusive of vitamin B12      Relevant Orders   Vitamin B12   Elevated PSA measurement   Pending repeat PSA not currently followed by urology      Relevant Orders   PSA, Medicare   Other Visit Diagnoses       Screening for prostate cancer       Relevant Orders   PSA, Medicare       Return in about 1 year (around 06/06/2025) for CPE' and labs.   Adina Crandall, NP

## 2024-06-06 NOTE — Assessment & Plan Note (Signed)
 Remote history of smoking.  Pending urine microscopy rule out microscopic hematuria

## 2024-06-06 NOTE — Assessment & Plan Note (Signed)
 History of same was evaluated by [facility] with MRI that showed neuritis to cranial nerve VII and VIII.  Patient has done rehab exercises and wears a hearing aid with modest relief.  Patient is consider seeing neurology in regards to the lightheadedness but does not want to do it yet.  Patient can reach out to me with a referral to neurology and plan time.  Neurological exam benign in office today.

## 2024-06-06 NOTE — Assessment & Plan Note (Signed)
 Ambiguous pending labs inclusive of vitamin B12

## 2024-06-06 NOTE — Assessment & Plan Note (Signed)
 Likely sequela of Ramsay Hunt syndrome.  Patient is functioning neurological exam benign.  Stable at this juncture

## 2024-06-13 ENCOUNTER — Ambulatory Visit: Payer: Self-pay

## 2024-06-13 NOTE — Telephone Encounter (Signed)
 FYI Only or Action Required?: FYI only for provider.  Patient was last seen in primary care on 06/06/2024 by Wendee Lynwood HERO, NP.  Called Nurse Triage reporting Animal Bite.  Symptoms began today.  Interventions attempted: Rest, hydration, or home remedies.  Symptoms are: unchanged.  Triage Disposition: Go to ED Now (Notify PCP)  Patient/caregiver understands and will follow disposition?: Yes  Copied from CRM [id number]. Topic: Clinical - Red Word Triage >> Jun 13, 2024  4:36 PM Jasmin G wrote: Red Word that prompted transfer to Nurse Triage: Pt states that he got bitten by a cat on right hand recently and is experiencing pain and swelling when closing and opening his hand. Reason for Disposition  [1] Any break in skin from BITE (e.g., cut, puncture or scratch) AND[2] PET animal (e.g., dog, cat, or ferret) at risk for RABIES (e.g., sick, stray, unprovoked bite, developing country)  Answer Assessment - Initial Assessment Questions Pt had separated a cat fight this morning and was accidentally bitten by one of the cats. Vaccination status unknown. Pt states his last tetanus was in 2005. Puncture wound present, pt have soreness and swelling. RN recommending ER at this time and provided education.   1. APPEARANCE What does it look like?  (e.g., abrasion, bruise, puncture)      One puncture wound; feels swollen 2. SIZE: How big is the bite? (e.g., inches, cm; or compare to size of coin, pea, grape, ping pong ball)      Smaller than a pea 3. LOCATION: Where is the bite located?      Hand; Right 4. ONSET: When did the bite happen? (e.g., minutes, hours ago)      This morning 5. ANIMAL: What type of animal caused the bite? Is the injury from a bite or a claw? If the animal is a dog or a cat, ask: Was it a pet or a stray? Was it acting ill or behaving strangely?     Cat; Unsure on vaccination status 6. RABIES VACCINE: For dog or cat bites, ask: Do you know if the pet is  vaccinated against rabies?  (e.g., yes, no, overdue for rabies shot, unknown)     Unsure 7. CIRCUMSTANCES: Tell me how this happened.      Cat and neighbors cat were fighting, pt picked up neighbors cat and started to put over the fence when cat bit his hand.  8. TETANUS: When was your last tetanus booster?     Not UTD  Protocols used: Animal Bite-A-AH

## 2024-06-14 ENCOUNTER — Other Ambulatory Visit

## 2024-06-14 DIAGNOSIS — E782 Mixed hyperlipidemia: Secondary | ICD-10-CM | POA: Diagnosis not present

## 2024-06-14 DIAGNOSIS — R972 Elevated prostate specific antigen [PSA]: Secondary | ICD-10-CM

## 2024-06-14 DIAGNOSIS — Z87891 Personal history of nicotine dependence: Secondary | ICD-10-CM

## 2024-06-14 DIAGNOSIS — Z125 Encounter for screening for malignant neoplasm of prostate: Secondary | ICD-10-CM | POA: Diagnosis not present

## 2024-06-14 DIAGNOSIS — R42 Dizziness and giddiness: Secondary | ICD-10-CM | POA: Diagnosis not present

## 2024-06-14 DIAGNOSIS — R5383 Other fatigue: Secondary | ICD-10-CM | POA: Diagnosis not present

## 2024-06-14 LAB — LIPID PANEL
Cholesterol: 179 mg/dL (ref 0–200)
HDL: 43.5 mg/dL (ref >39.00)
LDL Cholesterol: 116 mg/dL — ABNORMAL HIGH (ref 0–99)
NonHDL: 135.46
Total CHOL/HDL Ratio: 4
Triglycerides: 98 mg/dL (ref 0.0–149.0)
VLDL: 19.6 mg/dL (ref 0.0–40.0)

## 2024-06-14 LAB — URINALYSIS, MICROSCOPIC ONLY

## 2024-06-14 LAB — COMPREHENSIVE METABOLIC PANEL WITH GFR
ALT: 14 U/L (ref 0–53)
AST: 17 U/L (ref 0–37)
Albumin: 4.7 g/dL (ref 3.5–5.2)
Alkaline Phosphatase: 47 U/L (ref 39–117)
BUN: 15 mg/dL (ref 6–23)
CO2: 28 meq/L (ref 19–32)
Calcium: 9.3 mg/dL (ref 8.4–10.5)
Chloride: 102 meq/L (ref 96–112)
Creatinine, Ser: 1.01 mg/dL (ref 0.40–1.50)
GFR: 75.53 mL/min (ref >60.00)
Glucose, Bld: 104 mg/dL — ABNORMAL HIGH (ref 70–99)
Potassium: 4 meq/L (ref 3.5–5.1)
Sodium: 139 meq/L (ref 135–145)
Total Bilirubin: 1.1 mg/dL (ref 0.2–1.2)
Total Protein: 7 g/dL (ref 6.0–8.3)

## 2024-06-14 LAB — CBC
HCT: 44.6 % (ref 39.0–52.0)
Hemoglobin: 15.7 g/dL (ref 13.0–17.0)
MCHC: 35.1 g/dL (ref 30.0–36.0)
MCV: 86.8 fl (ref 78.0–100.0)
Platelets: 234 K/uL (ref 150.0–400.0)
RBC: 5.14 Mil/uL (ref 4.22–5.81)
RDW: 13.3 % (ref 11.5–15.5)
WBC: 10.6 K/uL — ABNORMAL HIGH (ref 4.0–10.5)

## 2024-06-14 LAB — PSA, MEDICARE: PSA: 5.44 ng/mL — ABNORMAL HIGH (ref 0.10–4.00)

## 2024-06-14 LAB — VITAMIN B12: Vitamin B-12: 253 pg/mL (ref 211–911)

## 2024-06-14 NOTE — Telephone Encounter (Signed)
 Agree with immediate evaluation and disposition

## 2024-06-21 ENCOUNTER — Ambulatory Visit: Payer: Self-pay | Admitting: Nurse Practitioner

## 2024-06-21 DIAGNOSIS — R972 Elevated prostate specific antigen [PSA]: Secondary | ICD-10-CM

## 2024-07-01 NOTE — Addendum Note (Signed)
 Addended by: WENDEE LYNWOOD HERO on: 07/01/2024 12:48 PM   Modules accepted: Orders

## 2024-07-17 ENCOUNTER — Other Ambulatory Visit: Payer: Self-pay | Admitting: Family Medicine

## 2024-07-17 DIAGNOSIS — E782 Mixed hyperlipidemia: Secondary | ICD-10-CM

## 2024-08-06 ENCOUNTER — Other Ambulatory Visit

## 2024-08-06 DIAGNOSIS — R972 Elevated prostate specific antigen [PSA]: Secondary | ICD-10-CM | POA: Diagnosis not present

## 2024-08-06 LAB — PSA: PSA: 6.24 ng/mL — ABNORMAL HIGH (ref 0.10–4.00)

## 2024-08-12 ENCOUNTER — Ambulatory Visit: Payer: Self-pay | Admitting: Nurse Practitioner

## 2025-06-06 ENCOUNTER — Encounter: Admitting: Nurse Practitioner
